# Patient Record
Sex: Female | Born: 1950 | Race: White | Hispanic: No | Smoking: Never smoker
Health system: Southern US, Community
[De-identification: ages and names within clinical notes are randomized; demographics above are authoritative.]

## PROBLEM LIST (undated history)

## (undated) DIAGNOSIS — K219 Gastro-esophageal reflux disease without esophagitis: Secondary | ICD-10-CM

## (undated) DIAGNOSIS — Z8719 Personal history of other diseases of the digestive system: Secondary | ICD-10-CM

## (undated) DIAGNOSIS — F419 Anxiety disorder, unspecified: Secondary | ICD-10-CM

## (undated) DIAGNOSIS — M810 Age-related osteoporosis without current pathological fracture: Secondary | ICD-10-CM

## (undated) DIAGNOSIS — M1990 Unspecified osteoarthritis, unspecified site: Secondary | ICD-10-CM

## (undated) DIAGNOSIS — Z79899 Other long term (current) drug therapy: Secondary | ICD-10-CM

## (undated) DIAGNOSIS — H3530 Unspecified macular degeneration: Secondary | ICD-10-CM

## (undated) DIAGNOSIS — M47812 Spondylosis without myelopathy or radiculopathy, cervical region: Secondary | ICD-10-CM

## (undated) DIAGNOSIS — T7840XA Allergy, unspecified, initial encounter: Secondary | ICD-10-CM

## (undated) DIAGNOSIS — C189 Malignant neoplasm of colon, unspecified: Secondary | ICD-10-CM

## (undated) HISTORY — DX: Anxiety disorder, unspecified: F41.9

## (undated) HISTORY — DX: Spondylosis without myelopathy or radiculopathy, cervical region: M47.812

## (undated) HISTORY — DX: Age-related osteoporosis without current pathological fracture: M81.0

## (undated) HISTORY — PX: BREAST SURGERY: SHX581

## (undated) HISTORY — DX: Unspecified osteoarthritis, unspecified site: M19.90

## (undated) HISTORY — PX: AUGMENTATION MAMMAPLASTY: SUR837

## (undated) HISTORY — PX: ABDOMINAL HYSTERECTOMY: SHX81

## (undated) HISTORY — DX: Personal history of other diseases of the digestive system: Z87.19

## (undated) HISTORY — DX: Allergy, unspecified, initial encounter: T78.40XA

## (undated) HISTORY — DX: Other long term (current) drug therapy: Z79.899

## (undated) HISTORY — DX: Malignant neoplasm of colon, unspecified: C18.9

## (undated) HISTORY — PX: APPENDECTOMY: SHX54

## (undated) HISTORY — PX: CERVICAL DISCECTOMY: SHX98

## (undated) HISTORY — PX: CATARACT EXTRACTION: SUR2

## (undated) HISTORY — DX: Gastro-esophageal reflux disease without esophagitis: K21.9

## (undated) HISTORY — DX: Unspecified macular degeneration: H35.30

---

## 1999-11-28 DIAGNOSIS — C189 Malignant neoplasm of colon, unspecified: Secondary | ICD-10-CM

## 1999-11-28 HISTORY — PX: HEMICOLECTOMY: SHX854

## 1999-11-28 HISTORY — DX: Malignant neoplasm of colon, unspecified: C18.9

## 2000-11-15 ENCOUNTER — Encounter: Payer: Self-pay | Admitting: Family Medicine

## 2002-03-03 ENCOUNTER — Encounter: Payer: Self-pay | Admitting: Family Medicine

## 2002-11-12 ENCOUNTER — Encounter: Payer: Self-pay | Admitting: Family Medicine

## 2003-01-19 ENCOUNTER — Encounter: Payer: Self-pay | Admitting: Family Medicine

## 2007-11-04 ENCOUNTER — Encounter: Payer: Self-pay | Admitting: Family Medicine

## 2009-10-27 LAB — HM PAP SMEAR

## 2017-02-28 DIAGNOSIS — H3530 Unspecified macular degeneration: Secondary | ICD-10-CM | POA: Diagnosis not present

## 2017-02-28 DIAGNOSIS — G4700 Insomnia, unspecified: Secondary | ICD-10-CM | POA: Diagnosis not present

## 2017-02-28 DIAGNOSIS — M4802 Spinal stenosis, cervical region: Secondary | ICD-10-CM | POA: Diagnosis not present

## 2017-02-28 DIAGNOSIS — M810 Age-related osteoporosis without current pathological fracture: Secondary | ICD-10-CM | POA: Diagnosis not present

## 2017-08-09 DIAGNOSIS — H2513 Age-related nuclear cataract, bilateral: Secondary | ICD-10-CM | POA: Diagnosis not present

## 2017-08-09 DIAGNOSIS — H43813 Vitreous degeneration, bilateral: Secondary | ICD-10-CM | POA: Diagnosis not present

## 2017-08-09 DIAGNOSIS — H353221 Exudative age-related macular degeneration, left eye, with active choroidal neovascularization: Secondary | ICD-10-CM | POA: Diagnosis not present

## 2017-08-09 DIAGNOSIS — H353112 Nonexudative age-related macular degeneration, right eye, intermediate dry stage: Secondary | ICD-10-CM | POA: Diagnosis not present

## 2017-08-28 LAB — HM MAMMOGRAPHY

## 2017-08-28 LAB — HEPATIC FUNCTION PANEL
ALT: 13 (ref 7–35)
AST: 18 (ref 13–35)

## 2017-08-28 LAB — LIPID PANEL
CHOLESTEROL: 164 (ref 0–200)
HDL: 74 — AB (ref 35–70)
LDL Cholesterol: 78
TRIGLYCERIDES: 64 (ref 40–160)

## 2017-08-28 LAB — CBC AND DIFFERENTIAL
HEMATOCRIT: 41 (ref 36–46)
HEMOGLOBIN: 13.7 (ref 12.0–16.0)
PLATELETS: 200 (ref 150–399)
WBC: 3

## 2017-08-28 LAB — TSH: TSH: 2.93 (ref 0.41–5.90)

## 2017-08-28 LAB — BASIC METABOLIC PANEL
BUN: 18 (ref 4–21)
Creatinine: 0.9 (ref <=1.1)
Glucose: 88
Potassium: 5 (ref 3.4–5.3)
Sodium: 144 (ref 137–147)

## 2017-08-28 LAB — VITAMIN D 25 HYDROXY (VIT D DEFICIENCY, FRACTURES): VIT D 25 HYDROXY: 31

## 2017-08-28 LAB — HEMOGLOBIN A1C: Hemoglobin A1C: 5.7

## 2017-08-28 LAB — HM DEXA SCAN

## 2017-11-29 DIAGNOSIS — H2513 Age-related nuclear cataract, bilateral: Secondary | ICD-10-CM | POA: Diagnosis not present

## 2017-11-29 DIAGNOSIS — H43813 Vitreous degeneration, bilateral: Secondary | ICD-10-CM | POA: Diagnosis not present

## 2017-11-29 DIAGNOSIS — H353112 Nonexudative age-related macular degeneration, right eye, intermediate dry stage: Secondary | ICD-10-CM | POA: Diagnosis not present

## 2017-11-29 DIAGNOSIS — H353221 Exudative age-related macular degeneration, left eye, with active choroidal neovascularization: Secondary | ICD-10-CM | POA: Diagnosis not present

## 2017-12-07 DIAGNOSIS — Z85038 Personal history of other malignant neoplasm of large intestine: Secondary | ICD-10-CM | POA: Diagnosis not present

## 2017-12-07 LAB — HM COLONOSCOPY

## 2018-01-29 DIAGNOSIS — H353221 Exudative age-related macular degeneration, left eye, with active choroidal neovascularization: Secondary | ICD-10-CM | POA: Diagnosis not present

## 2018-01-29 DIAGNOSIS — H353112 Nonexudative age-related macular degeneration, right eye, intermediate dry stage: Secondary | ICD-10-CM | POA: Diagnosis not present

## 2018-03-12 DIAGNOSIS — H353111 Nonexudative age-related macular degeneration, right eye, early dry stage: Secondary | ICD-10-CM | POA: Diagnosis not present

## 2018-03-12 DIAGNOSIS — H2513 Age-related nuclear cataract, bilateral: Secondary | ICD-10-CM | POA: Diagnosis not present

## 2018-03-12 DIAGNOSIS — H353221 Exudative age-related macular degeneration, left eye, with active choroidal neovascularization: Secondary | ICD-10-CM | POA: Diagnosis not present

## 2018-04-09 DIAGNOSIS — H353221 Exudative age-related macular degeneration, left eye, with active choroidal neovascularization: Secondary | ICD-10-CM | POA: Diagnosis not present

## 2018-04-09 DIAGNOSIS — H353111 Nonexudative age-related macular degeneration, right eye, early dry stage: Secondary | ICD-10-CM | POA: Diagnosis not present

## 2018-04-09 DIAGNOSIS — H2513 Age-related nuclear cataract, bilateral: Secondary | ICD-10-CM | POA: Diagnosis not present

## 2018-06-18 DIAGNOSIS — H353221 Exudative age-related macular degeneration, left eye, with active choroidal neovascularization: Secondary | ICD-10-CM | POA: Diagnosis not present

## 2018-06-18 DIAGNOSIS — H353111 Nonexudative age-related macular degeneration, right eye, early dry stage: Secondary | ICD-10-CM | POA: Diagnosis not present

## 2018-06-18 DIAGNOSIS — H2513 Age-related nuclear cataract, bilateral: Secondary | ICD-10-CM | POA: Diagnosis not present

## 2018-07-31 DIAGNOSIS — H353221 Exudative age-related macular degeneration, left eye, with active choroidal neovascularization: Secondary | ICD-10-CM | POA: Diagnosis not present

## 2018-07-31 DIAGNOSIS — H353111 Nonexudative age-related macular degeneration, right eye, early dry stage: Secondary | ICD-10-CM | POA: Diagnosis not present

## 2018-07-31 DIAGNOSIS — H2513 Age-related nuclear cataract, bilateral: Secondary | ICD-10-CM | POA: Diagnosis not present

## 2018-08-23 ENCOUNTER — Encounter: Payer: Self-pay | Admitting: Family Medicine

## 2018-08-23 ENCOUNTER — Other Ambulatory Visit: Payer: Self-pay

## 2018-08-23 ENCOUNTER — Ambulatory Visit (INDEPENDENT_AMBULATORY_CARE_PROVIDER_SITE_OTHER): Payer: Medicare Other | Admitting: Family Medicine

## 2018-08-23 ENCOUNTER — Encounter: Payer: Self-pay | Admitting: Emergency Medicine

## 2018-08-23 VITALS — BP 98/60 | HR 71 | Temp 98.0°F | Ht 64.25 in | Wt 118.4 lb

## 2018-08-23 DIAGNOSIS — M47812 Spondylosis without myelopathy or radiculopathy, cervical region: Secondary | ICD-10-CM

## 2018-08-23 DIAGNOSIS — Z85038 Personal history of other malignant neoplasm of large intestine: Secondary | ICD-10-CM | POA: Insufficient documentation

## 2018-08-23 DIAGNOSIS — Z23 Encounter for immunization: Secondary | ICD-10-CM | POA: Diagnosis not present

## 2018-08-23 DIAGNOSIS — G4701 Insomnia due to medical condition: Secondary | ICD-10-CM

## 2018-08-23 DIAGNOSIS — M810 Age-related osteoporosis without current pathological fracture: Secondary | ICD-10-CM | POA: Diagnosis not present

## 2018-08-23 DIAGNOSIS — H3530 Unspecified macular degeneration: Secondary | ICD-10-CM | POA: Insufficient documentation

## 2018-08-23 DIAGNOSIS — G8929 Other chronic pain: Secondary | ICD-10-CM | POA: Insufficient documentation

## 2018-08-23 DIAGNOSIS — Z8719 Personal history of other diseases of the digestive system: Secondary | ICD-10-CM | POA: Diagnosis not present

## 2018-08-23 DIAGNOSIS — Z9049 Acquired absence of other specified parts of digestive tract: Secondary | ICD-10-CM | POA: Insufficient documentation

## 2018-08-23 DIAGNOSIS — Z79899 Other long term (current) drug therapy: Secondary | ICD-10-CM | POA: Diagnosis not present

## 2018-08-23 HISTORY — DX: Spondylosis without myelopathy or radiculopathy, cervical region: M47.812

## 2018-08-23 HISTORY — DX: Age-related osteoporosis without current pathological fracture: M81.0

## 2018-08-23 HISTORY — DX: Personal history of other diseases of the digestive system: Z87.19

## 2018-08-23 HISTORY — DX: Other long term (current) drug therapy: Z79.899

## 2018-08-23 MED ORDER — TRAMADOL HCL 50 MG PO TABS: 50.0000 mg | ORAL_TABLET | Freq: Four times a day (QID) | ORAL | 5 refills | Status: DC

## 2018-08-23 MED ORDER — LORAZEPAM 1 MG PO TABS: 1.0000 mg | ORAL_TABLET | Freq: Every day | ORAL | 3 refills | Status: DC

## 2018-08-23 MED ORDER — ZOSTER VAC RECOMB ADJUVANTED 50 MCG/0.5ML IM SUSR: 0.5000 mL | Freq: Once | INTRAMUSCULAR | 0 refills | Status: AC

## 2018-08-23 NOTE — Patient Instructions (Signed)
Please returfor your annual complete physical; please come fasting. n in October or November  Medicare recommends an [employer] Visit for all patients. Please schedule this to be done with our Nurse Educator, Maudie Mercury. This is an informative "talk" visit; it's goals are to ensure that your health care needs are being met and to give you education regarding avoiding falls, ensuring you are not suffering from depression or problems with memory or thinking, and to educate you on Advance Care Planning. It helps me take good care of you!  It was a pleasure meeting you today! Thank you for choosing [place] to meet your healthcare needs! I truly look forward to working with you. If you have any questions or concerns, please send me a message via Mychart or call the office at [phone].

## 2018-08-23 NOTE — Progress Notes (Signed)
Subjective  CC:  Chief Complaint  Patient presents with  . Establish Care    Relocated here from [place], last Physcical 08/28/2017  . Neck Pain    from Spinal Fusion surgery     HPI: Haley Huerta is a 67 y.o. female who presents to [place] at [facility] today to establish care with me as a new patient.   She has the following concerns or needs:  A pleasant 67 year old divorced single female who has a son who lives here in [place], this is the reason she recently relocated from [place].  Overall she does very well.  She brings in her medical records for my review.  I reviewed past medical records for the last several years by her PCP, past surgical reports, past colonoscopy, immunizations and lab work.  Also reviewed her recent bone density.  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  She does well with this.  She is had no adverse effects.  She also uses Ativan at night to help relax the musculature and help her sleep.  This combination has worked for her over the last several years.  She is hesitant to change it.  She requests refills.  She denies mood problems, cognitive dysfunction or falls.  She has a history of colon cancer status post hemicolectomy and history of ulcerative colitis.  Her colonoscopy cancer and surveillance is up-to-date.  She will need a gastroenterology referral in the next year or 2 for surveillance.  She has no current issues with colitis.  She eats a healthy diet.  Bowel movements are normal.  Osteoporosis by recent bone density scan.  Takes over-the-counter vitamin D and alternative therapies.  Has not ever taken biphosphonate's or other bone therapies.  Health maintenance: Due for pneumococcal vaccination and flu shot.  Also can get Shingrix.  Next physical due in October.  Lab work due at that  time.  Assessment  1. Need for influenza vaccination   2. Insomnia secondary to chronic pain   3. History of colon cancer   4. S/P left hemicolectomy   5. Spondylosis of cervical region without myelopathy or radiculopathy   6. Need for pneumococcal vaccination   7. Age-related osteoporosis without current pathological fracture   8. Chronic use of benzodiazepine for therapeutic purpose   9. History of ulcerative colitis      Plan   Chronic medical problems are well controlled.  We reviewed her current pain management strategy.  We will continue unchanged.  Medications refilled.  Controlled substance contract signed.  Will get urine drug screen at next visit.  Monitor use.  Reiterate caution and risk versus benefits.  Patient highly functional now.  Doing well.  Immunizations updated today.  Started conversation regarding osteoporosis treatment options.  Could possibly tolerate biphosphonate's.  If is intolerant can consider Prolia.  Will check blood work at next visit including calcium and vitamin D.  Follow up:  Return in about 6 weeks (around 10/04/2018) for complete physical, AWV at patient's convenience. Orders Placed This Encounter  Procedures  . HM MAMMOGRAPHY  . HM DEXA SCAN  . Flu vaccine HIGH DOSE PF  . Pneumococcal polysaccharide vaccine 23-valent greater than or equal to 2yo subcutaneous/IM  . HM PAP SMEAR  . HM COLONOSCOPY   Meds ordered this encounter  Medications  . LORazepam (ATIVAN) 1  MG tablet    Sig: Take 1 tablet (1 mg total) by mouth at bedtime.    Dispense:  90 tablet    Refill:  3  . traMADol (ULTRAM) 50 MG tablet    Sig: Take 1 tablet (50 mg total) by mouth 4 (four) times daily.    Dispense:  120 tablet    Refill:  5  . Zoster Vaccine Adjuvanted [facility]) injection    Sig: Inject 0.5 mLs into the muscle once for 1 dose. Please give 2nd dose 2-6 months after first dose    Dispense:  2 each    Refill:  0     Depression screen [facility] 2/9 08/23/2018    Decreased Interest 0  Down, Depressed, Hopeless 0  PHQ - 2 Score 0    We updated and reviewed the patient's past history in detail and it is documented below.  Patient Active Problem List   Diagnosis Date Noted  . 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 History of colon cancer 08/23/2018  . S/P left hemicolectomy 08/23/2018    Chemo and radiation and hemicolectomy 2002   . DJD (degenerative joint disease) of cervical spine 08/23/2018    Chronic pain on tramadol   . Macular degeneration 08/23/2018  . Osteoporosis 08/23/2018    Dexa 08/2017 lowest T=-2.8; vit d and calcium; rec tx, pt to think about it.    . Chronic use of benzodiazepine for therapeutic purpose 08/23/2018    Uses for neck pain, muscle tension (failed tricyclics, mm relaxers) and neck pain.   Marland Kitchen History of ulcerative colitis 08/23/2018   Health Maintenance  Topic Date Due  . Hepatitis C Screening  [birth year]  . PNA vac Low Risk Adult (2 of 2 - PCV13) 08/24/2019  . MAMMOGRAM  08/29/2019  . COLONOSCOPY  12/07/2020  . TETANUS/TDAP  08/07/2024  . INFLUENZA VACCINE  Completed  . DEXA SCAN  Completed   Immunization History  Administered Date(s) Administered  . Influenza, High Dose Seasonal PF 08/23/2018  . Pneumococcal Polysaccharide-23 08/23/2018  . Tdap 08/07/2014  . Zoster 08/06/2013   Current Meds  Medication Sig  . LORazepam (ATIVAN) 1 MG tablet Take 1 tablet (1 mg total) by mouth at bedtime.  . traMADol (ULTRAM) 50 MG tablet Take 1 tablet (50 mg total) by mouth 4 (four) times daily.  . [DISCONTINUED] LORazepam (ATIVAN) 1 MG tablet Take 1 mg by mouth at bedtime.  . [DISCONTINUED] traMADol (ULTRAM) 50 MG tablet Take 50 mg by mouth 4 (four) times daily.    Allergies: Patient has No Known Allergies. Past Medical History Patient  has a past medical history of Allergy, Arthritis, Chronic use of benzodiazepine for therapeutic purpose (08/23/2018), Colon cancer ([place]) (2001), DJD (degenerative joint  disease) of cervical spine (08/23/2018), GERD (gastroesophageal reflux disease), History of ulcerative colitis (08/23/2018), and Osteoporosis (08/23/2018). Past Surgical History Patient  has no past surgical history on file. Family History: Patient family history includes Arthritis in her mother; Congestive Heart Failure in her father; Hypertension in her father; Prostate cancer in her father; Restless legs syndrome in her father and son; Stroke in her mother. Social History:  Patient  reports that she has never smoked. She has never used smokeless tobacco. She reports that she drinks alcohol. She reports that she does not use drugs.  Review of Systems: Constitutional: negative for fever or malaise Ophthalmic: negative for photophobia, double vision or loss of vision Cardiovascular: negative for chest pain, dyspnea on exertion, or new LE swelling  Respiratory: negative for SOB or persistent cough Gastrointestinal: negative for abdominal pain, change in bowel habits or melena Genitourinary: negative for dysuria or gross hematuria Musculoskeletal: negative for new gait disturbance or muscular weakness Integumentary: negative for new or persistent rashes Neurological: negative for TIA or stroke symptoms Psychiatric: negative for SI or delusions Allergic/Immunologic: negative for hives  Patient Care Team    Relationship Specialty Notifications Start End  Leamon Arnt, MD PCP - General Family Medicine  08/23/18   Sherlynn Stalls, MD Consulting Physician Ophthalmology  08/23/18     Objective  Vitals: BP 98/60   Pulse 71   Temp 98 F (36.7 C)   Ht 5' 4.25" (1.632 m)   Wt 118 lb 6.4 oz (53.7 kg)   SpO2 99%   BMI 20.17 kg/m  General:  Well developed, well nourished, no acute distress  Psych:  Alert and oriented,normal mood and affect, appears happy HEENT:  Normocephalic, atraumatic, non-icteric sclera, PERRL, oropharynx is without mass or exudate, supple neck without adenopathy, mass or  thyromegaly, nontender spine Cardiovascular:  RRR without gallop, rub or murmur, nondisplaced PMI Respiratory:  Good breath sounds bilaterally, CTAB with normal respiratory effort Gastrointestinal: normal bowel sounds, soft, non-tender, no noted masses. No HSM MSK: no deformities, contusions. Joints are without erythema or swelling Skin:  Warm, no rashes or suspicious lesions noted Neurologic:    Mental status is normal. Gross motor and sensory exams are normal. Normal gait   Commons side effects, risks, benefits, and alternatives for medications and treatment plan prescribed today were discussed, and the patient expressed understanding of the given instructions. Patient is instructed to call or message via MyChart if he/she has any questions or concerns regarding our treatment plan. No barriers to understanding were identified. We discussed Red Flag symptoms and signs in detail. Patient expressed understanding regarding what to do in case of urgent or emergency type symptoms.   Medication list was reconciled, printed and provided to the patient in AVS. Patient instructions and summary information was reviewed with the patient as documented in the AVS. This note was prepared with assistance of Dragon voice recognition software. Occasional wrong-word or sound-a-like substitutions may have occurred due to the inherent limitations of voice recognition software

## 2018-08-27 ENCOUNTER — Encounter: Payer: Self-pay | Admitting: Emergency Medicine

## 2018-09-27 DIAGNOSIS — H353221 Exudative age-related macular degeneration, left eye, with active choroidal neovascularization: Secondary | ICD-10-CM | POA: Diagnosis not present

## 2018-09-27 DIAGNOSIS — H353111 Nonexudative age-related macular degeneration, right eye, early dry stage: Secondary | ICD-10-CM | POA: Diagnosis not present

## 2018-09-27 DIAGNOSIS — H2513 Age-related nuclear cataract, bilateral: Secondary | ICD-10-CM | POA: Diagnosis not present

## 2018-10-01 ENCOUNTER — Encounter: Payer: Self-pay | Admitting: Family Medicine

## 2018-10-15 NOTE — Progress Notes (Signed)
Subjective:   Haley Huerta is a 67 y.o. female who presents for Medicare Annual (Subsequent) preventive examination.  Review of Systems:  No ROS.  Medicare Wellness Visit. Additional risk factors are reflected in the social history.  Cardiac Risk Factors include: advanced age (>56men, >66 women);family history of premature cardiovascular disease   Sleep patterns: Sleeps 7-8 hours, interrupted d/t neck/shoulder.  Home Safety/Smoke Alarms: Feels safe in home. Smoke alarms in place.  Living environment; residence and Firearm Safety: Lives alone in 1 story home.  Seat Belt Safety/Bike Helmet: Wears seat belt.   Female:   Pap-N/A    Mammo-08/28/2017, normal. Prefers every 2 years.        Dexa scan-08/28/2017       CCS-Colonoscopy 12/07/2017, [place]. Pt reports normal. Recall 3-5 years. H/O colon Ca.      Objective:     Vitals: BP 128/68 (BP Location: Left Arm, Patient Position: Sitting, Cuff Size: Normal)   Pulse 68   Temp 98 F (36.7 C) (Temporal)   Resp 16   Ht 5\' 4"  (1.626 m)   Wt 119 lb 4 oz (54.1 kg)   SpO2 98%   BMI 20.47 kg/m   Body mass index is 20.47 kg/m.  Advanced Directives 10/16/2018  Does Patient Have a Medical Advance Directive? No  Would patient like information on creating a medical advance directive? Yes (MAU/Ambulatory/Procedural Areas - Information given)    Tobacco Social History   Tobacco Use  Smoking Status Never Smoker  Smokeless Tobacco Never Used     Counseling given: Not Answered    Past Medical History:  Diagnosis Date  . Allergy   . Arthritis   . 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  . Colon cancer ([place]) 2001  . 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 GERD (gastroesophageal reflux disease)   . History of ulcerative colitis 08/23/2018  . Osteoporosis 08/23/2018     Dexa 08/2017 lowest T=-2.8; vit d and calcium; rec tx, pt to think about it.    Past Surgical History:  Procedure Laterality Date  . ABDOMINAL HYSTERECTOMY    . APPENDECTOMY    . BREAST SURGERY     Implants  . CERVICAL DISCECTOMY     Fusion   . HEMICOLECTOMY  2001   R- Cancer    Family History  Problem Relation Age of Onset  . Arthritis Mother   . Stroke Mother   . Prostate cancer Father   . Congestive Heart Failure Father   . Hypertension Father   . Restless legs syndrome Father   . Depression Son   . Restless legs syndrome Brother    Social History   Socioeconomic History  . Marital status: Divorced    Spouse name: Not on file  . Number of children: 1  . Years of education: Not on file  . Highest education level: Not on file  Occupational History  . Occupation: retired, Quarry manager etc  Social Needs  . Financial resource strain: Not on file  . Food insecurity:    Worry: Not on file    Inability: Not on file  . Transportation needs:    Medical: Not on file    Non-medical: Not on file  Tobacco Use  . Smoking status: Never Smoker  . Smokeless tobacco: Never Used  Substance and Sexual Activity  .  Alcohol use: Yes    Comment: rarely   . Drug use: Never  . Sexual activity: Not Currently    Birth control/protection: Post-menopausal  Lifestyle  . Physical activity:    Days per week: Not on file    Minutes per session: Not on file  . Stress: Not on file  Relationships  . Social connections:    Talks on phone: Not on file    Gets together: Not on file    Attends religious service: Not on file    Active member of club or organization: Not on file    Attends meetings of clubs or organizations: Not on file    Relationship status: Not on file  Other Topics Concern  . Not on file  Social History Narrative  . Not on file    Outpatient Encounter Medications as of 10/16/2018  Medication Sig  . diphenhydrAMINE (BENADRYL) 25 MG tablet Take 25 mg by  mouth every 6 (six) hours as needed.  . fluticasone (FLONASE) 50 MCG/ACT nasal spray Place into both nostrils daily.  Marland Kitchen ibuprofen (ADVIL,MOTRIN) 200 MG tablet Take 200 mg by mouth every 6 (six) hours as needed.  Marland Kitchen LORazepam (ATIVAN) 1 MG tablet Take 1 tablet (1 mg total) by mouth at bedtime.  . Multiple Vitamins-Minerals (PRESERVISION AREDS PO) Take by mouth.  . NON FORMULARY 1 % every morning.  . NON FORMULARY daily.  . traMADol (ULTRAM) 50 MG tablet Take 1 tablet (50 mg total) by mouth 4 (four) times daily.   No facility-administered encounter medications on file as of 10/16/2018.     Activities of Daily Living In your present state of health, do you have any difficulty performing the following activities: 10/16/2018 08/23/2018  Hearing? N N  Vision? N N  Comment - Macular Degneration   Difficulty concentrating or making decisions? N N  Walking or climbing stairs? N N  Dressing or bathing? N N  Doing errands, shopping? N N  Preparing Food and eating ? N -  Using the Toilet? N -  In the past six months, have you accidently leaked urine? N -  Do you have problems with loss of bowel control? N -  Managing your Medications? N -  Managing your Finances? N -  Housekeeping or managing your Housekeeping? N -  Some recent data might be hidden    Patient Care Team: Leamon Arnt, MD as PCP - General (Family Medicine) Sherlynn Stalls, MD as Consulting Physician (Ophthalmology)    Assessment:   This is a routine wellness examination for [place].  Exercise Activities and Dietary recommendations Current Exercise Habits: Home exercise routine(Walks up/down stairs between buildings in neighborhood), Type of exercise: walking, Time (Minutes): 40, Frequency (Times/Week): 3, Weekly Exercise (Minutes/Week): 120, Exercise limited by: None identified   Diet (meal preparation, eat out, water intake, caffeinated beverages, dairy products, fruits and vegetables): Drinks water and tea.    Breakfast: eggs; cereal; pancakes; tea Lunch: soup, sandwich Dinner: protein shake  Goals    . Patient Stated     Increase social activity.        Fall Risk Fall Risk  10/16/2018 08/23/2018  Falls in the past year? 0 No    Depression Screen PHQ 2/9 Scores 10/16/2018 08/23/2018  PHQ - 2 Score 0 0     Cognitive Function MMSE - Mini Mental State Exam 10/16/2018  Orientation to time 4  Orientation to Place 5  Registration 3  Attention/ Calculation 5  Recall 2  Language- name  2 objects 2  Language- repeat 1  Language- follow 3 step command 3  Language- read & follow direction 1  Write a sentence 1  Copy design 1  Total score 28        Immunization History  Administered Date(s) Administered  . Influenza, High Dose Seasonal PF 08/23/2018  . Pneumococcal Polysaccharide-23 08/23/2018  . Tdap 08/07/2014  . Zoster 08/06/2013  . Zoster Recombinat (Shingrix) 09/26/2018     Screening Tests Health Maintenance  Topic Date Due  . Hepatitis C Screening  10/17/2019 (Originally Nov 17, 1951)  . PNA vac Low Risk Adult (2 of 2 - PCV13) 08/24/2019  . MAMMOGRAM  08/29/2019  . DEXA SCAN  08/29/2019  . COLONOSCOPY  12/07/2020  . TETANUS/TDAP  08/07/2024  . INFLUENZA VACCINE  Completed       Plan:    Continue doing brain stimulating activities (puzzles, reading, adult coloring books, staying active) to keep memory sharp.   Bring a copy of your living will and/or healthcare power of attorney to your next office visit.  I have personally reviewed and noted the following in the patient's chart:   . Medical and social history . Use of alcohol, tobacco or illicit drugs  . Current medications and supplements . Functional ability and status . Nutritional status . Physical activity . Advanced directives . List of other physicians . Hospitalizations, surgeries, and ER visits in previous 12 months . Vitals . Screenings to include cognitive, depression, and  falls . Referrals and appointments  In addition, I have reviewed and discussed with patient certain preventive protocols, quality metrics, and best practice recommendations. A written personalized care plan for preventive services as well as general preventive health recommendations were provided to patient.     Gerilyn Nestle, RN  10/16/2018

## 2018-10-16 ENCOUNTER — Ambulatory Visit (INDEPENDENT_AMBULATORY_CARE_PROVIDER_SITE_OTHER): Payer: Medicare Other | Admitting: Family Medicine

## 2018-10-16 ENCOUNTER — Encounter: Payer: Self-pay | Admitting: Family Medicine

## 2018-10-16 ENCOUNTER — Other Ambulatory Visit: Payer: Self-pay

## 2018-10-16 ENCOUNTER — Ambulatory Visit (INDEPENDENT_AMBULATORY_CARE_PROVIDER_SITE_OTHER): Payer: Medicare Other

## 2018-10-16 VITALS — BP 128/68 | HR 68 | Temp 98.0°F | Resp 16 | Ht 64.0 in | Wt 119.2 lb

## 2018-10-16 VITALS — BP 128/68 | HR 68 | Temp 98.0°F | Resp 16 | Ht 64.0 in | Wt 119.4 lb

## 2018-10-16 DIAGNOSIS — Z79899 Other long term (current) drug therapy: Secondary | ICD-10-CM

## 2018-10-16 DIAGNOSIS — Z85038 Personal history of other malignant neoplasm of large intestine: Secondary | ICD-10-CM

## 2018-10-16 DIAGNOSIS — Z0000 Encounter for general adult medical examination without abnormal findings: Secondary | ICD-10-CM | POA: Diagnosis not present

## 2018-10-16 DIAGNOSIS — M810 Age-related osteoporosis without current pathological fracture: Secondary | ICD-10-CM

## 2018-10-16 DIAGNOSIS — M47812 Spondylosis without myelopathy or radiculopathy, cervical region: Secondary | ICD-10-CM | POA: Diagnosis not present

## 2018-10-16 DIAGNOSIS — Z8719 Personal history of other diseases of the digestive system: Secondary | ICD-10-CM

## 2018-10-16 DIAGNOSIS — Z1159 Encounter for screening for other viral diseases: Secondary | ICD-10-CM | POA: Diagnosis not present

## 2018-10-16 LAB — CBC WITH DIFFERENTIAL/PLATELET
Basophils Absolute: 0 10*3/uL (ref 0.0–0.1)
Basophils Relative: 0.8 % (ref 0.0–3.0)
EOS PCT: 2 % (ref 0.0–5.0)
Eosinophils Absolute: 0.1 10*3/uL (ref 0.0–0.7)
HCT: 40.7 % (ref 36.0–46.0)
Hemoglobin: 13.5 g/dL (ref 12.0–15.0)
LYMPHS ABS: 1.6 10*3/uL (ref 0.7–4.0)
Lymphocytes Relative: 29.2 % (ref 12.0–46.0)
MCHC: 33.2 g/dL (ref 30.0–36.0)
MCV: 96.6 fl (ref 78.0–100.0)
MONOS PCT: 9.7 % (ref 3.0–12.0)
Monocytes Absolute: 0.5 10*3/uL (ref 0.1–1.0)
Neutro Abs: 3.2 10*3/uL (ref 1.4–7.7)
Neutrophils Relative %: 58.3 % (ref 43.0–77.0)
Platelets: 229 10*3/uL (ref 150.0–400.0)
RBC: 4.21 Mil/uL (ref 3.87–5.11)
RDW: 14 % (ref 11.5–15.5)
WBC: 5.4 10*3/uL (ref 4.0–10.5)

## 2018-10-16 LAB — COMPREHENSIVE METABOLIC PANEL
ALT: 12 U/L (ref 0–35)
AST: 20 U/L (ref 0–37)
Albumin: 4.1 g/dL (ref 3.5–5.2)
Alkaline Phosphatase: 56 U/L (ref 39–117)
BUN: 21 mg/dL (ref 6–23)
CO2: 28 meq/L (ref 19–32)
Calcium: 9.2 mg/dL (ref 8.4–10.5)
Chloride: 100 mEq/L (ref 96–112)
Creatinine, Ser: 0.94 mg/dL (ref 0.40–1.20)
GFR: 62.75 mL/min (ref >60.00)
GLUCOSE: 80 mg/dL (ref 70–99)
Potassium: 3.7 mEq/L (ref 3.5–5.1)
Sodium: 137 mEq/L (ref 135–145)
Total Bilirubin: 0.6 mg/dL (ref 0.2–1.2)
Total Protein: 6.9 g/dL (ref 6.0–8.3)

## 2018-10-16 LAB — LIPID PANEL
CHOL/HDL RATIO: 2
Cholesterol: 165 mg/dL (ref 0–200)
HDL: 73.2 mg/dL (ref >39.00)
LDL Cholesterol: 78 mg/dL (ref 0–99)
NONHDL: 91.74
Triglycerides: 68 mg/dL (ref 0.0–149.0)
VLDL: 13.6 mg/dL (ref 0.0–40.0)

## 2018-10-16 LAB — TSH: TSH: 4.51 u[IU]/mL — ABNORMAL HIGH (ref 0.35–4.50)

## 2018-10-16 NOTE — Progress Notes (Signed)
I have reviewed the documentation from the recent AWV done by Kim Broome; I agree with the documentation and will follow up on any recommendations or abnormal findings as suggested.  

## 2018-10-16 NOTE — Progress Notes (Signed)
Subjective  Chief Complaint  Patient presents with  . Annual Exam    doing well, no complaints     HPI: Haley Huerta is a 67 y.o. female who presents to [place] at [facility] today for a Female Wellness Visit. She also has the concerns and/or needs as listed above in the chief complaint. These will be addressed in addition to the Health Maintenance Visit.   Wellness Visit: annual visit with health maintenance review and exam without Pap   Health maintenance: Due for mammography but patient defers.  Prefers 2-year screenings due to having firm breast implants and difficulty with exam.  She understands risks versus benefits and current recommendations.  Immunizations are up-to-date.  Reviewed annual wellness visit today Chronic disease f/u and/or acute problem visit: (deemed necessary to be done in addition to the wellness visit):  Follow-up osteoporosis: We had a discussion last visit and reviewed her most recent bone density scan.  Due to her history of ulcerative colitis and hemicolectomy we are going to elect Prolia treatment.  Want to avoid any gastritis or GI side effects from biphosphonate's.  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  Ulcerative colitis and colon cancer surveillance are stable and up-to-date   Assessment  1. Annual physical exam   2. History of ulcerative colitis   3. History of colon cancer- cecal adenocarcinoma   4. 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 Chronic use of benzodiazepine for therapeutic purpose   6. Spondylosis of cervical region without myelopathy or radiculopathy      Plan  Female Wellness Visit:  Age appropriate Health Maintenance and Prevention measures were discussed with patient. Included topics are cancer screening recommendations, ways to keep healthy (see AVS) including dietary and exercise recommendations, regular eye and dental care, use of seat  belts, and avoidance of moderate alcohol use and tobacco use.  Defer mammogram this year per patient request.  Both mammogram and bone density will be done next year  BMI: discussed patient's BMI and encouraged positive lifestyle modifications to help get to or maintain a target BMI.  HM needs and immunizations were addressed and ordered. See below for orders. See HM and immunization section for updates.  Routine labs and screening tests ordered including cmp, cbc and lipids where appropriate.  Discussed recommendations regarding Vit D and calcium supplementation (see AVS)  Chronic disease management visit and/or acute problem visit:  Osteoporosis: Start Prolia biannually.  Check vitamin D and calcium today.  After visit summary given on medication.  Chronic pain and insomnia due to pain well controlled.  Controlled substance agreement has been signed. Follow up: Return in about 6 months (around 04/16/2019) for Pain management.  Orders Placed This Encounter  Procedures  . Comprehensive metabolic panel  . CBC with Differential/Platelet  . Lipid panel  . TSH  . Hepatitis C antibody  . VITAMIN D 25 Hydroxy (Vit-D Deficiency, Fractures)  . Pain Mgmt, Profile 8 w/Conf, U   No orders of the defined types were placed in this encounter.     Lifestyle: Body mass index is 20.49 kg/m. Wt Readings from Last 3 Encounters:  10/16/18 119 lb 6.4 oz (54.2 kg)  10/16/18 119 lb 4 oz (54.1 kg)  08/23/18 118 lb 6.4 oz (53.7 kg)    Patient Active Problem List   Diagnosis Date Noted  . 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 History of colon cancer- cecal adenocarcinoma 08/23/2018  . S/P  left hemicolectomy 08/23/2018    Chemo and radiation and hemicolectomy 2002   . DJD (degenerative joint disease) of cervical spine 08/23/2018    Chronic pain on tramadol   . Macular degeneration 08/23/2018  . Osteoporosis 08/23/2018    Dexa 08/2017 lowest T=-2.8; vit d and calcium; rec tx, to  start prolia 09/2018   . Chronic use of benzodiazepine for therapeutic purpose 08/23/2018    Uses for neck pain, muscle tension (failed tricyclics, mm relaxers) and neck pain.   Marland Kitchen History of ulcerative colitis 08/23/2018   Health Maintenance  Topic Date Due  . MAMMOGRAM  08/28/2019 (Originally 08/28/2018)  . Hepatitis C Screening  10/17/2019 (Originally September 03, 1951)  . PNA vac Low Risk Adult (2 of 2 - PCV13) 08/24/2019  . DEXA SCAN  08/29/2019  . COLONOSCOPY  12/07/2020  . TETANUS/TDAP  08/07/2024  . INFLUENZA VACCINE  Completed   Immunization History  Administered Date(s) Administered  . Influenza, High Dose Seasonal PF 08/23/2018  . Pneumococcal Polysaccharide-23 08/23/2018  . Tdap 08/07/2014  . Zoster 08/06/2013  . Zoster Recombinat (Shingrix) 09/26/2018   We updated and reviewed the patient's past history in detail and it is documented below. Allergies: Patient is allergic to phenergan [promethazine hcl]. Past Medical History Patient  has a past medical history of Allergy, Arthritis, Chronic use of benzodiazepine for therapeutic purpose (08/23/2018), Colon cancer ([place]) (2001), DJD (degenerative joint disease) of cervical spine (08/23/2018), GERD (gastroesophageal reflux disease), History of ulcerative colitis (08/23/2018), and Osteoporosis (08/23/2018). Past Surgical History Patient  has a past surgical history that includes Abdominal hysterectomy; Hemicolectomy (2001); Cervical discectomy; Breast surgery; and Appendectomy. Family History: Patient family history includes Arthritis in her mother; Congestive Heart Failure in her father; Depression in her son; Hypertension in her father; Prostate cancer in her father; Restless legs syndrome in her brother and father; Stroke in her mother. Social History:  Patient  reports that she has never smoked. She has never used smokeless tobacco. She reports that she drinks alcohol. She reports that she does not use drugs.  Review of  Systems: Constitutional: negative for fever or malaise Ophthalmic: negative for photophobia, double vision or loss of vision Cardiovascular: negative for chest pain, dyspnea on exertion, or new LE swelling Respiratory: negative for SOB or persistent cough Gastrointestinal: negative for abdominal pain, change in bowel habits or melena Genitourinary: negative for dysuria or gross hematuria, no abnormal uterine bleeding or disharge Musculoskeletal: negative for new gait disturbance or muscular weakness Integumentary: negative for new or persistent rashes, no breast lumps Neurological: negative for TIA or stroke symptoms Psychiatric: negative for SI or delusions Allergic/Immunologic: negative for hives  Patient Care Team    Relationship Specialty Notifications Start End  Leamon Arnt, MD PCP - General Family Medicine  08/23/18   Sherlynn Stalls, MD Consulting Physician Ophthalmology  08/23/18     Objective  Vitals: BP 128/68   Pulse 68   Temp 98 F (36.7 C)   Resp 16   Ht 5\' 4"  (1.626 m)   Wt 119 lb 6.4 oz (54.2 kg)   SpO2 98%   BMI 20.49 kg/m  General:  Well developed, well nourished, no acute distress  Psych:  Alert and orientedx3,normal mood and affect HEENT:  Normocephalic, atraumatic, non-icteric sclera, PERRL, oropharynx is clear without mass or exudate, supple neck without adenopathy, mass or thyromegaly Cardiovascular:  Normal S1, S2, RRR without gallop, rub or murmur, nondisplaced PMI Respiratory:  Good breath sounds bilaterally, CTAB with normal respiratory effort Gastrointestinal: normal  bowel sounds, soft, non-tender, no noted masses. No HSM MSK: no deformities, contusions. Joints are without erythema or swelling. Spine and CVA region are nontender Skin:  Warm, no rashes or suspicious lesions noted Neurologic:    Mental status is normal. CN 2-11 are normal. Gross motor and sensory exams are normal. Normal gait.  Fine tremor present Breast Exam: Bilateral breast  implants, hard and on right limiting exam bilaterally.  No mass, skin retraction or nipple discharge is appreciated in either breast. No axillary adenopathy. Fibrocystic changes are not noted   Commons side effects, risks, benefits, and alternatives for medications and treatment plan prescribed today were discussed, and the patient expressed understanding of the given instructions. Patient is instructed to call or message via MyChart if he/she has any questions or concerns regarding our treatment plan. No barriers to understanding were identified. We discussed Red Flag symptoms and signs in detail. Patient expressed understanding regarding what to do in case of urgent or emergency type symptoms.   Medication list was reconciled, printed and provided to the patient in AVS. Patient instructions and summary information was reviewed with the patient as documented in the AVS. This note was prepared with assistance of Dragon voice recognition software. Occasional wrong-word or sound-a-like substitutions may have occurred due to the inherent limitations of voice recognition software

## 2018-10-16 NOTE — Patient Instructions (Signed)
Please return in 6 months Chronic pain management.  If you have any questions or concerns, please don't hesitate to send me a message via MyChart or call the office at [phone]. Thank you for visiting with [place] today! It's our pleasure caring for you.  We will call you to set up your appointment for Prolia injection to treat your osteoporosis.   Please do these things to maintain good health!   Exercise at least 30-45 minutes a day,  4-5 days a week.   Eat a low-fat diet with lots of fruits and vegetables, up to 7-9 servings per day.  Drink plenty of water daily. Try to drink 8 8oz glasses per day.  Seatbelts can save your life. Always wear your seatbelt.  Place Smoke Detectors on every level of your home and check batteries every year.  Schedule an appointment with an eye doctor for an eye exam every 1-2 years  Safe sex - use condoms to protect yourself from STDs if you could be exposed to these types of infections. Use birth control if you do not want to become pregnant and are sexually active.  Avoid heavy alcohol use. If you drink, keep it to less than 2 drinks/day and not every day.  [place].  Choose someone you trust that could speak for you if you became unable to speak for yourself.  Depression is common in our stressful world.If you're feeling down or losing interest in things you normally enjoy, please come in for a visit.  If anyone is threatening or hurting you, please get help. Physical or Emotional Violence is never OK.   Denosumab injection What is this medicine? DENOSUMAB (den oh sue mab) slows bone breakdown. Prolia is used to treat osteoporosis in women after menopause and in men. Delton See is used to treat a high calcium level due to cancer and to prevent bone fractures and other bone problems caused by multiple myeloma or cancer bone metastases. Delton See is also used to treat giant cell tumor of the bone. This medicine may be used for other  purposes; ask your health care provider or pharmacist if you have questions. COMMON BRAND NAME(S): Prolia, XGEVA What should I tell my health care provider before I take this medicine? They need to know if you have any of these conditions: -dental disease -having surgery or tooth extraction -infection -kidney disease -low levels of calcium or Vitamin D in the blood -malnutrition -on hemodialysis -skin conditions or sensitivity -thyroid or parathyroid disease -an unusual reaction to denosumab, other medicines, foods, dyes, or preservatives -pregnant or trying to get pregnant -breast-feeding How should I use this medicine? This medicine is for injection under the skin. It is given by a health care professional in a hospital or clinic setting. If you are getting Prolia, a special MedGuide will be given to you by the pharmacist with each prescription and refill. Be sure to read this information carefully each time. For Prolia, talk to your pediatrician regarding the use of this medicine in children. Special care may be needed. For Delton See, talk to your pediatrician regarding the use of this medicine in children. While this drug may be prescribed for children as young as 13 years for selected conditions, precautions do apply. Overdosage: If you think you have taken too much of this medicine contact a poison control center or emergency room at once. NOTE: This medicine is only for you. Do not share this medicine with others. What if I miss a dose?  It is important not to miss your dose. Call your doctor or health care professional if you are unable to keep an appointment. What may interact with this medicine? Do not take this medicine with any of the following medications: -other medicines containing denosumab This medicine may also interact with the following medications: -medicines that lower your chance of fighting infection -steroid medicines like prednisone or cortisone This list may not  describe all possible interactions. Give your health care provider a list of all the medicines, herbs, non-prescription drugs, or dietary supplements you use. Also tell them if you smoke, drink alcohol, or use illegal drugs. Some items may interact with your medicine. What should I watch for while using this medicine? Visit your doctor or health care professional for regular checks on your progress. Your doctor or health care professional may order blood tests and other tests to see how you are doing. Call your doctor or health care professional for advice if you get a fever, chills or sore throat, or other symptoms of a cold or flu. Do not treat yourself. This drug may decrease your body's ability to fight infection. Try to avoid being around people who are sick. You should make sure you get enough calcium and vitamin D while you are taking this medicine, unless your doctor tells you not to. Discuss the foods you eat and the vitamins you take with your health care professional. See your dentist regularly. Brush and floss your teeth as directed. Before you have any dental work done, tell your dentist you are receiving this medicine. Do not become pregnant while taking this medicine or for 5 months after stopping it. Talk with your doctor or health care professional about your birth control options while taking this medicine. Women should inform their doctor if they wish to become pregnant or think they might be pregnant. There is a potential for serious side effects to an unborn child. Talk to your health care professional or pharmacist for more information. What side effects may I notice from receiving this medicine? Side effects that you should report to your doctor or health care professional as soon as possible: -allergic reactions like skin rash, itching or hives, swelling of the face, lips, or tongue -bone pain -breathing problems -dizziness -jaw pain, especially after dental work -redness,  blistering, peeling of the skin -signs and symptoms of infection like fever or chills; cough; sore throat; pain or trouble passing urine -signs of low calcium like fast heartbeat, muscle cramps or muscle pain; pain, tingling, numbness in the hands or feet; seizures -unusual bleeding or bruising -unusually weak or tired Side effects that usually do not require medical attention (report to your doctor or health care professional if they continue or are bothersome): -constipation -diarrhea -headache -joint pain -loss of appetite -muscle pain -runny nose -tiredness -upset stomach This list may not describe all possible side effects. Call your doctor for medical advice about side effects. You may report side effects to FDA at 1-800-FDA-1088. Where should I keep my medicine? This medicine is only given in a clinic, doctor's office, or other health care setting and will not be stored at home. NOTE: This sheet is a summary. It may not cover all possible information. If you have questions about this medicine, talk to your doctor, pharmacist, or health care provider.  2018 Elsevier/Gold Standard (2016-12-05 19:17:21)

## 2018-10-16 NOTE — Patient Instructions (Addendum)
Continue doing brain stimulating activities (puzzles, reading, adult coloring books, staying active) to keep memory sharp.   Bring a copy of your living will and/or healthcare power of attorney to your next office visit.    RE: MyChart  Dear Haley Huerta  We are excited to introduce MyChart, a new best-in-class service that provides you online access to important information in your electronic medical record. We want to make it easier for you to view your health information - all in one secure location - when and where you need it. We expect MyChart will enhance the quality of care and service we provide. Use the activation code below to enroll in MyChart online at [email or web address]  When you register for MyChart, you can:  Marland Kitchen View your test results. . Communicate securely with your physician's office.  . View your medical history, allergies, medications, and immunizations. . Conveniently print information such as your medication lists.  If you are age 69 or older and want a member of your family to have access to your record, you must provide written consent by completing a proxy form available at our facility. Please speak to our clinical staff about guidelines regarding accounts for patients younger than age 98.  As you activate your MyChart account and need any technical assistance, please call the MyChart technical support line at [phone]).  Thank you for using MyChart as your new health and wellness resource!  MyChart Activation Code:  [email or web address] Expires: 11/30/2018 10:41 AM           [facility]  [place], [place] [address] Health Maintenance, Female Adopting a healthy lifestyle and getting preventive care can go a long way to promote health and wellness. Talk with your health care provider about what schedule of regular examinations is right for you. This is a good chance for you to check in with your provider about disease  prevention and staying healthy. In between checkups, there are plenty of things you can do on your own. Experts have done a lot of research about which lifestyle changes and preventive measures are most likely to keep you healthy. Ask your health care provider for more information. Weight and diet Eat a healthy diet  Be sure to include plenty of vegetables, fruits, low-fat dairy products, and lean protein.  Do not eat a lot of foods high in solid fats, added sugars, or salt.  Get regular exercise. This is one of the most important things you can do for your health. ? Most adults should exercise for at least 150 minutes each week. The exercise should increase your heart rate and make you sweat (moderate-intensity exercise). ? Most adults should also do strengthening exercises at least twice a week. This is in addition to the moderate-intensity exercise.  Maintain a healthy weight  Body mass index (BMI) is a measurement that can be used to identify possible weight problems. It estimates body fat based on height and weight. Your health care provider can help determine your BMI and help you achieve or maintain a healthy weight.  For females 31 years of age and older: ? A BMI below 18.5 is considered underweight. ? A BMI of 18.5 to 24.9 is normal. ? A BMI of 25 to 29.9 is considered overweight. ? A BMI of 30 and above is considered obese.  Watch levels of cholesterol and blood lipids  You should start having your blood tested for lipids and cholesterol at 69 years of age, then have  this test every 5 years.  You may need to have your cholesterol levels checked more often if: ? Your lipid or cholesterol levels are high. ? You are older than 69 years of age. ? You are at high risk for heart disease.  Cancer screening Lung Cancer  Lung cancer screening is recommended for adults 61-5 years old who are at high risk for lung cancer because of a history of smoking.  A yearly low-dose CT scan  of the lungs is recommended for people who: ? Currently smoke. ? Have quit within the past 15 years. ? Have at least a 30-pack-year history of smoking. A pack year is smoking an average of one pack of cigarettes a day for 1 year.  Yearly screening should continue until it has been 15 years since you quit.  Yearly screening should stop if you develop a health problem that would prevent you from having lung cancer treatment.  Breast Cancer  Practice breast self-awareness. This means understanding how your breasts normally appear and feel.  It also means doing regular breast self-exams. Let your health care provider know about any changes, no matter how small.  If you are in your 20s or 30s, you should have a clinical breast exam (CBE) by a health care provider every 1-3 years as part of a regular health exam.  If you are 80 or older, have a CBE every year. Also consider having a breast X-ray (mammogram) every year.  If you have a family history of breast cancer, talk to your health care provider about genetic screening.  If you are at high risk for breast cancer, talk to your health care provider about having an MRI and a mammogram every year.  Breast cancer gene (BRCA) assessment is recommended for women who have family members with BRCA-related cancers. BRCA-related cancers include: ? Breast. ? Ovarian. ? Tubal. ? Peritoneal cancers.  Results of the assessment will determine the need for genetic counseling and BRCA1 and BRCA2 testing.  Cervical Cancer Your health care provider may recommend that you be screened regularly for cancer of the pelvic organs (ovaries, uterus, and vagina). This screening involves a pelvic examination, including checking for microscopic changes to the surface of your cervix (Pap test). You may be encouraged to have this screening done every 3 years, beginning at age 75.  For women ages 80-65, health care providers may recommend pelvic exams and Pap testing  every 3 years, or they may recommend the Pap and pelvic exam, combined with testing for human papilloma virus (HPV), every 5 years. Some types of HPV increase your risk of cervical cancer. Testing for HPV may also be done on women of any age with unclear Pap test results.  Other health care providers may not recommend any screening for nonpregnant women who are considered low risk for pelvic cancer and who do not have symptoms. Ask your health care provider if a screening pelvic exam is right for you.  If you have had past treatment for cervical cancer or a condition that could lead to cancer, you need Pap tests and screening for cancer for at least 20 years after your treatment. If Pap tests have been discontinued, your risk factors (such as having a new sexual partner) need to be reassessed to determine if screening should resume. Some women have medical problems that increase the chance of getting cervical cancer. In these cases, your health care provider may recommend more frequent screening and Pap tests.  Colorectal Cancer  This  type of cancer can be detected and often prevented.  Routine colorectal cancer screening usually begins at 69 years of age and continues through 69 years of age.  Your health care provider may recommend screening at an earlier age if you have risk factors for colon cancer.  Your health care provider may also recommend using home test kits to check for hidden blood in the stool.  A small camera at the end of a tube can be used to examine your colon directly (sigmoidoscopy or colonoscopy). This is done to check for the earliest forms of colorectal cancer.  Routine screening usually begins at age 58.  Direct examination of the colon should be repeated every 5-10 years through 69 years of age. However, you may need to be screened more often if early forms of precancerous polyps or small growths are found.  Skin Cancer  Check your skin from head to toe  regularly.  Tell your health care provider about any new moles or changes in moles, especially if there is a change in a mole's shape or color.  Also tell your health care provider if you have a mole that is larger than the size of a pencil eraser.  Always use sunscreen. Apply sunscreen liberally and repeatedly throughout the day.  Protect yourself by wearing long sleeves, pants, a wide-brimmed hat, and sunglasses whenever you are outside.  Heart disease, diabetes, and high blood pressure  High blood pressure causes heart disease and increases the risk of stroke. High blood pressure is more likely to develop in: ? People who have blood pressure in the high end of the normal range (130-139/85-89 mm Hg). ? People who are overweight or obese. ? People who are African American.  If you are 17-55 years of age, have your blood pressure checked every 3-5 years. If you are 72 years of age or older, have your blood pressure checked every year. You should have your blood pressure measured twice-once when you are at a hospital or clinic, and once when you are not at a hospital or clinic. Record the average of the two measurements. To check your blood pressure when you are not at a hospital or clinic, you can use: ? An automated blood pressure machine at a pharmacy. ? A home blood pressure monitor.  If you are between 70 years and 60 years old, ask your health care provider if you should take aspirin to prevent strokes.  Have regular diabetes screenings. This involves taking a blood sample to check your fasting blood sugar level. ? If you are at a normal weight and have a low risk for diabetes, have this test once every three years after 69 years of age. ? If you are overweight and have a high risk for diabetes, consider being tested at a younger age or more often. Preventing infection Hepatitis B  If you have a higher risk for hepatitis B, you should be screened for this virus. You are considered  at high risk for hepatitis B if: ? You were born in a country where hepatitis B is common. Ask your health care provider which countries are considered high risk. ? Your parents were born in a high-risk country, and you have not been immunized against hepatitis B (hepatitis B vaccine). ? You have HIV or AIDS. ? You use needles to inject street drugs. ? You live with someone who has hepatitis B. ? You have had sex with someone who has hepatitis B. ? You get hemodialysis treatment. ?  You take certain medicines for conditions, including cancer, organ transplantation, and autoimmune conditions.  Hepatitis C  Blood testing is recommended for: ? Everyone born from [birth year] through 1967. ? Anyone with known risk factors for hepatitis C.  Sexually transmitted infections (STIs)  You should be screened for sexually transmitted infections (STIs) including gonorrhea and chlamydia if: ? You are sexually active and are younger than 69 years of age. ? You are older than 69 years of age and your health care provider tells you that you are at risk for this type of infection. ? Your sexual activity has changed since you were last screened and you are at an increased risk for chlamydia or gonorrhea. Ask your health care provider if you are at risk.  If you do not have HIV, but are at risk, it may be recommended that you take a prescription medicine daily to prevent HIV infection. This is called pre-exposure prophylaxis (PrEP). You are considered at risk if: ? You are sexually active and do not regularly use condoms or know the HIV status of your partner(s). ? You take drugs by injection. ? You are sexually active with a partner who has HIV.  Talk with your health care provider about whether you are at high risk of being infected with HIV. If you choose to begin PrEP, you should first be tested for HIV. You should then be tested every 3 months for as long as you are taking PrEP. Pregnancy  If you are  premenopausal and you may become pregnant, ask your health care provider about preconception counseling.  If you may become pregnant, take 400 to 800 micrograms (mcg) of folic acid every day.  If you want to prevent pregnancy, talk to your health care provider about birth control (contraception). Osteoporosis and menopause  Osteoporosis is a disease in which the bones lose minerals and strength with aging. This can result in serious bone fractures. Your risk for osteoporosis can be identified using a bone density scan.  If you are 59 years of age or older, or if you are at risk for osteoporosis and fractures, ask your health care provider if you should be screened.  Ask your health care provider whether you should take a calcium or vitamin D supplement to lower your risk for osteoporosis.  Menopause may have certain physical symptoms and risks.  Hormone replacement therapy may reduce some of these symptoms and risks. Talk to your health care provider about whether hormone replacement therapy is right for you. Follow these instructions at home:  Schedule regular health, dental, and eye exams.  Stay current with your immunizations.  Do not use any tobacco products including cigarettes, chewing tobacco, or electronic cigarettes.  If you are pregnant, do not drink alcohol.  If you are breastfeeding, limit how much and how often you drink alcohol.  Limit alcohol intake to no more than 1 drink per day for nonpregnant women. One drink equals 12 ounces of beer, 5 ounces of wine, or 1 ounces of hard liquor.  Do not use street drugs.  Do not share needles.  Ask your health care provider for help if you need support or information about quitting drugs.  Tell your health care provider if you often feel depressed.  Tell your health care provider if you have ever been abused or do not feel safe at home. This information is not intended to replace advice given to you by your health care  provider. Make sure you discuss any questions you  have with your health care provider. Document Released: 05/29/2011 Document Revised: 04/20/2016 Document Reviewed: 08/17/2015 Elsevier Interactive Patient Education  [employer].

## 2018-10-17 ENCOUNTER — Telehealth: Payer: Self-pay

## 2018-10-17 LAB — HEPATITIS C ANTIBODY
Hepatitis C Ab: NONREACTIVE
SIGNAL TO CUT-OFF: 0.01 (ref <1.00)

## 2018-10-17 NOTE — Telephone Encounter (Signed)
Pt. Is scheduled for Prolia injection 10/18/18

## 2018-10-18 ENCOUNTER — Ambulatory Visit (INDEPENDENT_AMBULATORY_CARE_PROVIDER_SITE_OTHER): Payer: Medicare Other

## 2018-10-18 DIAGNOSIS — M810 Age-related osteoporosis without current pathological fracture: Secondary | ICD-10-CM | POA: Diagnosis not present

## 2018-10-18 MED ORDER — DENOSUMAB 60 MG/ML ~~LOC~~ SOSY
60.0000 mg | PREFILLED_SYRINGE | Freq: Once | SUBCUTANEOUS | Status: AC
  Administered 2018-10-18: 60 mg via SUBCUTANEOUS

## 2018-10-18 NOTE — Progress Notes (Signed)
Administered Prolia injection into right arm SubQ. Pt. Tolerated well. Scheduled for next injection May 2020

## 2018-10-19 LAB — PAIN MGMT, PROFILE 8 W/CONF, U
6 Acetylmorphine: NEGATIVE ng/mL (ref <10)
ALCOHOL METABOLITES: NEGATIVE ng/mL (ref <500)
ALPHAHYDROXYALPRAZOLAM: NEGATIVE ng/mL (ref <25)
ALPHAHYDROXYMIDAZOLAM: NEGATIVE ng/mL (ref <50)
Alphahydroxytriazolam: NEGATIVE ng/mL (ref <50)
Aminoclonazepam: NEGATIVE ng/mL (ref <25)
Amphetamines: NEGATIVE ng/mL (ref <500)
BUPRENORPHINE, URINE: NEGATIVE ng/mL (ref <5)
Benzodiazepines: POSITIVE ng/mL — AB (ref <100)
COCAINE METABOLITE: NEGATIVE ng/mL (ref <150)
Creatinine: 47.7 mg/dL
HYDROXYETHYLFLURAZEPAM: NEGATIVE ng/mL (ref <50)
LORAZEPAM: 411 ng/mL — AB (ref <50)
MARIJUANA METABOLITE: NEGATIVE ng/mL (ref <20)
MDMA: NEGATIVE ng/mL (ref <500)
Nordiazepam: NEGATIVE ng/mL (ref <50)
Opiates: NEGATIVE ng/mL (ref <100)
Oxazepam: NEGATIVE ng/mL (ref <50)
Oxidant: NEGATIVE ug/mL (ref <200)
Oxycodone: NEGATIVE ng/mL (ref <100)
Temazepam: NEGATIVE ng/mL (ref <50)
pH: 6.62 (ref 4.5–9.0)

## 2018-11-04 ENCOUNTER — Telehealth: Payer: Self-pay

## 2018-11-04 NOTE — Telephone Encounter (Signed)
Vitamin D was ordered after other labs were released, it was not drawn. I can call patient to return to have lab performed.

## 2018-11-04 NOTE — Telephone Encounter (Signed)
-----   Message from Leamon Arnt, MD sent at 11/04/2018 11:05 AM EST ----- Can you please check on this vit D lab? It hasn't been resulted.   Thanks, Dr. Jonni Sanger  ----- Message ----- From: SYSTEM Sent: 10/21/2018  12:09 AM EST To: Leamon Arnt, MD

## 2018-11-04 NOTE — Telephone Encounter (Signed)
Yes please. Because she is on Prolia, I need to make sure her vit D levels are in the normal range. Thanks.

## 2018-11-06 NOTE — Telephone Encounter (Signed)
Pt scheduled for Vit D lab Friday 11/08/18

## 2018-11-08 ENCOUNTER — Other Ambulatory Visit: Payer: Medicare Other

## 2018-11-11 ENCOUNTER — Other Ambulatory Visit (INDEPENDENT_AMBULATORY_CARE_PROVIDER_SITE_OTHER): Payer: Medicare Other

## 2018-11-11 DIAGNOSIS — E559 Vitamin D deficiency, unspecified: Secondary | ICD-10-CM | POA: Diagnosis not present

## 2018-11-11 LAB — VITAMIN D 25 HYDROXY (VIT D DEFICIENCY, FRACTURES): VITD: 24.62 ng/mL — ABNORMAL LOW (ref 30.00–100.00)

## 2018-11-12 ENCOUNTER — Encounter: Payer: Self-pay | Admitting: Family Medicine

## 2018-11-22 ENCOUNTER — Other Ambulatory Visit: Payer: Self-pay

## 2018-11-22 ENCOUNTER — Encounter: Payer: Self-pay | Admitting: Family Medicine

## 2018-11-22 ENCOUNTER — Ambulatory Visit (INDEPENDENT_AMBULATORY_CARE_PROVIDER_SITE_OTHER): Payer: Medicare Other | Admitting: Family Medicine

## 2018-11-22 VITALS — BP 126/74 | HR 72 | Temp 99.3°F | Resp 16 | Ht 64.0 in | Wt 121.0 lb

## 2018-11-22 DIAGNOSIS — Z79899 Other long term (current) drug therapy: Secondary | ICD-10-CM | POA: Diagnosis not present

## 2018-11-22 DIAGNOSIS — J040 Acute laryngitis: Secondary | ICD-10-CM

## 2018-11-22 DIAGNOSIS — M47812 Spondylosis without myelopathy or radiculopathy, cervical region: Secondary | ICD-10-CM

## 2018-11-22 MED ORDER — TRAMADOL HCL 50 MG PO TABS: 50.0000 mg | ORAL_TABLET | Freq: Four times a day (QID) | ORAL | 2 refills | Status: DC

## 2018-11-22 MED ORDER — LORAZEPAM 1 MG PO TABS: 1.0000 mg | ORAL_TABLET | Freq: Every day | ORAL | 0 refills | Status: DC

## 2018-11-22 MED ORDER — OLOPATADINE HCL 0.2 % OP SOLN: 1.0000 [drp] | Freq: Every day | OPHTHALMIC | 5 refills | Status: DC

## 2018-11-22 NOTE — Patient Instructions (Signed)
Please follow up if symptoms do not improve or as needed.   

## 2018-11-22 NOTE — Progress Notes (Signed)
Subjective   CC:  Chief Complaint  Patient presents with  . Hoarse    Started 2 weeks ago.. Reports neck swelling.. Had head cold symptoms for 2 days.. Taking OTC mucous medication    HPI: Haley Huerta is a 67 y.o. female who presents to the office today to address the problems listed above in the chief complaint.  Patient complains of typical URI symptoms including nasal congestion, mild sore throat, cough, and mild malaise.  The symptoms have been present for about 2 weeks although they have changed: started with the sore throat, then had URI congestion and cough; these things have resolved but now with hoarseness, throat clearing and PND. No fevers, malaise, myalgias, cough, sob or gi sxs.  Over-the-counter cold medicines have been minimally or mildly helpful. Using tylenol sinus.   Will need refills on ativan and tramadol for chronic use: see PL. Will be due in march and next visit here is in may.   Assessment  1. Laryngitis   2. Chronic use of benzodiazepine for therapeutic purpose   3. Spondylosis of cervical region without myelopathy or radiculopathy      Plan   URI, viral: discussed dx; no sign or sx of bacterial infection is present. Treat supportively with antihistamines, decongestants, and/or cough meds. See AVS for care instructions.   Refilled controlled meds; printed for start date in march when due.   Follow up: as scheduled.  04/16/2019    No orders of the defined types were placed in this encounter.  Meds ordered this encounter  Medications  . traMADol (ULTRAM) 50 MG tablet    Sig: Take 1 tablet (50 mg total) by mouth 4 (four) times daily.    Dispense:  120 tablet    Refill:  2  . LORazepam (ATIVAN) 1 MG tablet    Sig: Take 1 tablet (1 mg total) by mouth at bedtime.    Dispense:  90 tablet    Refill:  0  . Olopatadine HCl 0.2 % SOLN    Sig: Apply 1 drop to eye daily.    Dispense:  2.5 mL    Refill:  5      I reviewed the patients updated  PMH, FH, and SocHx.    Patient Active Problem List   Diagnosis Date Noted  . 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 History of colon cancer- cecal adenocarcinoma 08/23/2018  . S/P left hemicolectomy 08/23/2018  . DJD (degenerative joint disease) of cervical spine 08/23/2018  . Macular degeneration 08/23/2018  . Osteoporosis 08/23/2018  . Chronic use of benzodiazepine for therapeutic purpose 08/23/2018  . History of ulcerative colitis 08/23/2018   Current Meds  Medication Sig  . denosumab (PROLIA) 60 MG/ML SOSY injection Inject 60 mg into the skin every 6 (six) months.  . diphenhydrAMINE (BENADRYL) 25 MG tablet Take 25 mg by mouth every 6 (six) hours as needed.  . fluticasone (FLONASE) 50 MCG/ACT nasal spray Place into both nostrils daily.  Marland Kitchen ibuprofen (ADVIL,MOTRIN) 200 MG tablet Take 200 mg by mouth every 6 (six) hours as needed.  Derrill Memo ON 02/24/2019] LORazepam (ATIVAN) 1 MG tablet Take 1 tablet (1 mg total) by mouth at bedtime.  . Multiple Vitamins-Minerals (PRESERVISION AREDS PO) Take by mouth.  . NON FORMULARY 1 % every morning.  . NON FORMULARY daily.  Derrill Memo ON 02/24/2019] traMADol (ULTRAM) 50 MG tablet Take 1 tablet (50 mg total) by mouth 4 (four) times daily.  . [DISCONTINUED] LORazepam (ATIVAN) 1  MG tablet Take 1 tablet (1 mg total) by mouth at bedtime.  . [DISCONTINUED] traMADol (ULTRAM) 50 MG tablet Take 1 tablet (50 mg total) by mouth 4 (four) times daily.    Review of Systems: Constitutional: Negative for fever malaise or anorexia Cardiovascular: negative for chest pain Respiratory: negative for SOB or pleuritic chest pain Gastrointestinal: negative for abdominal pain  Objective  Vitals: BP 126/74   Pulse 72   Temp 99.3 F (37.4 C) (Oral)   Resp 16   Ht 5\' 4"  (1.626 m)   Wt 121 lb (54.9 kg)   SpO2 98%   BMI 20.77 kg/m  General: no acute respiratory distress, mild hoarseness Psych:  Alert and oriented, normal mood and affect HEENT:  Normocephalic, nasal congestion present, TMs w/o erythema, OP without erythema w/o exudate, + mild nontender cervical LAD, supple neck  Cardiovascular:  RRR without murmur or gallop. no peripheral edema Respiratory:  Good breath sounds bilaterally, CTAB with normal respiratory effort Skin:  Warm, no rashes Neurologic:   Mental status is normal. normal gait  Commons side effects, risks, benefits, and alternatives for medications and treatment plan prescribed today were discussed, and the patient expressed understanding of the given instructions. Patient is instructed to call or message via MyChart if he/she has any questions or concerns regarding our treatment plan. No barriers to understanding were identified. We discussed Red Flag symptoms and signs in detail. Patient expressed understanding regarding what to do in case of urgent or emergency type symptoms.   Medication list was reconciled, printed and provided to the patient in AVS. Patient instructions and summary information was reviewed with the patient as documented in the AVS. This note was prepared with assistance of Dragon voice recognition software. Occasional wrong-word or sound-a-like substitutions may have occurred due to the inherent limitations of voice recognition software

## 2018-12-06 DIAGNOSIS — H353221 Exudative age-related macular degeneration, left eye, with active choroidal neovascularization: Secondary | ICD-10-CM | POA: Diagnosis not present

## 2018-12-06 DIAGNOSIS — H43813 Vitreous degeneration, bilateral: Secondary | ICD-10-CM | POA: Diagnosis not present

## 2018-12-06 DIAGNOSIS — H2513 Age-related nuclear cataract, bilateral: Secondary | ICD-10-CM | POA: Diagnosis not present

## 2018-12-06 DIAGNOSIS — H353111 Nonexudative age-related macular degeneration, right eye, early dry stage: Secondary | ICD-10-CM | POA: Diagnosis not present

## 2019-02-10 ENCOUNTER — Ambulatory Visit (INDEPENDENT_AMBULATORY_CARE_PROVIDER_SITE_OTHER): Payer: Medicare Other | Admitting: Physician Assistant

## 2019-02-10 ENCOUNTER — Encounter: Payer: Self-pay | Admitting: Physician Assistant

## 2019-02-10 ENCOUNTER — Other Ambulatory Visit: Payer: Self-pay

## 2019-02-10 ENCOUNTER — Ambulatory Visit: Payer: Medicare Other | Admitting: Physician Assistant

## 2019-02-10 VITALS — BP 98/50 | HR 81 | Temp 98.5°F | Resp 16 | Ht 64.0 in | Wt 115.0 lb

## 2019-02-10 DIAGNOSIS — J208 Acute bronchitis due to other specified organisms: Secondary | ICD-10-CM

## 2019-02-10 DIAGNOSIS — B9689 Other specified bacterial agents as the cause of diseases classified elsewhere: Secondary | ICD-10-CM | POA: Diagnosis not present

## 2019-02-10 MED ORDER — AMOXICILLIN-POT CLAVULANATE 875-125 MG PO TABS: 1.0000 | ORAL_TABLET | Freq: Two times a day (BID) | ORAL | 0 refills | Status: DC

## 2019-02-10 MED ORDER — HYDROCOD POLST-CPM POLST ER 10-8 MG/5ML PO SUER: 5.0000 mL | Freq: Two times a day (BID) | ORAL | 0 refills | Status: DC | PRN

## 2019-02-10 NOTE — Progress Notes (Signed)
Patient presents to clinic today c/o 2 weeks of chest congestion and cough that is productive of thick clear/white sputum. Denies fever, chills. Notes fatigue and anorexia. Is hydrating well. Denies nausea/vomiting, chest pain or SOB. Denies sinus pain or ear pain. Denies recent travel or sick contact. Is taking Mucinex. Symptoms have been gradually worsening since onset.    Past Medical History:  Diagnosis Date  . Allergy   . Arthritis   . 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  . Colon cancer ([place]) 2001  . 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 GERD (gastroesophageal reflux disease)   . History of ulcerative colitis 08/23/2018  . Osteoporosis 08/23/2018   Dexa 08/2017 lowest T=-2.8; vit d and calcium; rec tx, pt to think about it.     Current Outpatient Medications on File Prior to Visit  Medication Sig Dispense Refill  . denosumab (PROLIA) 60 MG/ML SOSY injection Inject 60 mg into the skin every 6 (six) months.    . diphenhydrAMINE (BENADRYL) 25 MG tablet Take 25 mg by mouth every 6 (six) hours as needed.    . fluticasone (FLONASE) 50 MCG/ACT nasal spray Place into both nostrils daily.    Marland Kitchen ibuprofen (ADVIL,MOTRIN) 200 MG tablet Take 200 mg by mouth every 6 (six) hours as needed.    Derrill Memo ON 02/24/2019] LORazepam (ATIVAN) 1 MG tablet Take 1 tablet (1 mg total) by mouth at bedtime. 90 tablet 0  . Multiple Vitamins-Minerals (PRESERVISION AREDS PO) Take by mouth.    . NON FORMULARY 1 % every morning.    . NON FORMULARY daily.    . Olopatadine HCl 0.2 % SOLN Apply 1 drop to eye daily. 2.5 mL 5  . [START ON 02/24/2019] traMADol (ULTRAM) 50 MG tablet Take 1 tablet (50 mg total) by mouth 4 (four) times daily. 120 tablet 2   No current facility-administered medications on file prior to visit.     Allergies  Allergen Reactions   . Phenergan [Promethazine Hcl] Other (See Comments)    Restless legs    Family History  Problem Relation Age of Onset  . Arthritis Mother   . Stroke Mother   . Prostate cancer Father   . Congestive Heart Failure Father   . Hypertension Father   . Restless legs syndrome Father   . Depression Son   . Restless legs syndrome Brother     Social History   Socioeconomic History  . Marital status: Divorced    Spouse name: Not on file  . Number of children: 1  . Years of education: Not on file  . Highest education level: Not on file  Occupational History  . Occupation: retired, Quarry manager etc  Social Needs  . Financial resource strain: Not on file  . Food insecurity:    Worry: Not on file    Inability: Not on file  . Transportation needs:    Medical: Not on file    Non-medical: Not on file  Tobacco Use  . Smoking status: Never Smoker  . Smokeless tobacco: Never Used  Substance and Sexual Activity  . Alcohol use: Yes    Comment: rarely   . Drug use: Never  . Sexual activity: Not Currently    Birth control/protection: Post-menopausal  Lifestyle  . Physical activity:    Days per week: Not on file  Minutes per session: Not on file  . Stress: Not on file  Relationships  . Social connections:    Talks on phone: Not on file    Gets together: Not on file    Attends religious service: Not on file    Active member of club or organization: Not on file    Attends meetings of clubs or organizations: Not on file    Relationship status: Not on file  Other Topics Concern  . Not on file  Social History Narrative  . Not on file   Review of Systems - See HPI.  All other ROS are negative.  BP (!) 98/50   Pulse 81   Temp 98.5 F (36.9 C) (Oral)   Resp 16   Ht 5\' 4"  (1.626 m)   Wt 115 lb (52.2 kg)   SpO2 96%   BMI 19.74 kg/m   Physical Exam Vitals signs reviewed.  Constitutional:      Appearance: Normal appearance.  HENT:     Head: Normocephalic and  atraumatic.     Right Ear: Tympanic membrane normal.     Left Ear: Tympanic membrane normal.     Nose: Nose normal.     Mouth/Throat:     Mouth: Mucous membranes are moist.  Eyes:     Conjunctiva/sclera: Conjunctivae normal.  Neck:     Musculoskeletal: Neck supple.  Cardiovascular:     Rate and Rhythm: Normal rate and regular rhythm.     Pulses: Normal pulses.     Heart sounds: Normal heart sounds.  Pulmonary:     Effort: Pulmonary effort is normal.     Breath sounds: Normal breath sounds.  Neurological:     General: No focal deficit present.     Mental Status: She is alert.  Psychiatric:        Mood and Affect: Mood normal.     Assessment/Plan: 1. Acute bacterial bronchitis Rx Augmentin.  Increase fluids.  Rest.  Saline nasal spray.  Probiotic.  Mucinex as directed.  Humidifier in bedroom. Tussionex per orders.  Call or return to clinic if symptoms are not improving.    Leeanne Rio, PA-C

## 2019-02-10 NOTE — Patient Instructions (Signed)
Take antibiotic (Augmentin) as directed.  Increase fluids.  Get plenty of rest. Use Mucinex for congestion. Tussionex for cough. Take a daily probiotic (I recommend Align or Culturelle, but even Activia Yogurt may be beneficial).  A humidifier placed in the bedroom may offer some relief for a dry, scratchy throat of nasal irritation.  Read information below on acute bronchitis. Please call or return to clinic if symptoms are not improving.  Acute Bronchitis Bronchitis is when the airways that extend from the windpipe into the lungs get red, puffy, and painful (inflamed). Bronchitis often causes thick spit (mucus) to develop. This leads to a cough. A cough is the most common symptom of bronchitis. In acute bronchitis, the condition usually begins suddenly and goes away over time (usually in 2 weeks). Smoking, allergies, and asthma can make bronchitis worse. Repeated episodes of bronchitis may cause more lung problems.  HOME CARE  Rest.  Drink enough fluids to keep your pee (urine) clear or pale yellow (unless you need to limit fluids as told by your doctor).  Only take over-the-counter or prescription medicines as told by your doctor.  Avoid smoking and secondhand smoke. These can make bronchitis worse. If you are a smoker, think about using nicotine gum or skin patches. Quitting smoking will help your lungs heal faster.  Reduce the chance of getting bronchitis again by:  Washing your hands often.  Avoiding people with cold symptoms.  Trying not to touch your hands to your mouth, nose, or eyes.  Follow up with your doctor as told.  GET HELP IF: Your symptoms do not improve after 1 week of treatment. Symptoms include:  Cough.  Fever.  Coughing up thick spit.  Body aches.  Chest congestion.  Chills.  Shortness of breath.  Sore throat.  GET HELP RIGHT AWAY IF:   You have an increased fever.  You have chills.  You have severe shortness of breath.  You have bloody thick  spit (sputum).  You throw up (vomit) often.  You lose too much body fluid (dehydration).  You have a severe headache.  You faint.  MAKE SURE YOU:   Understand these instructions.  Will watch your condition.  Will get help right away if you are not doing well or get worse. Document Released: 05/01/2008 Document Revised: 07/16/2013 Document Reviewed: 05/06/2013 [facility] Patient Information 2015 [place], [place]. This information is not intended to replace advice given to you by your health care provider. Make sure you discuss any questions you have with your health care provider.

## 2019-03-04 DIAGNOSIS — H353221 Exudative age-related macular degeneration, left eye, with active choroidal neovascularization: Secondary | ICD-10-CM | POA: Diagnosis not present

## 2019-04-16 ENCOUNTER — Encounter: Payer: Self-pay | Admitting: Family Medicine

## 2019-04-16 ENCOUNTER — Telehealth: Payer: Self-pay | Admitting: Family Medicine

## 2019-04-16 ENCOUNTER — Ambulatory Visit (INDEPENDENT_AMBULATORY_CARE_PROVIDER_SITE_OTHER): Payer: Medicare Other | Admitting: Family Medicine

## 2019-04-16 ENCOUNTER — Other Ambulatory Visit: Payer: Self-pay

## 2019-04-16 VITALS — Wt 125.0 lb

## 2019-04-16 DIAGNOSIS — M47812 Spondylosis without myelopathy or radiculopathy, cervical region: Secondary | ICD-10-CM | POA: Diagnosis not present

## 2019-04-16 DIAGNOSIS — M810 Age-related osteoporosis without current pathological fracture: Secondary | ICD-10-CM

## 2019-04-16 DIAGNOSIS — Z79899 Other long term (current) drug therapy: Secondary | ICD-10-CM | POA: Diagnosis not present

## 2019-04-16 DIAGNOSIS — G4701 Insomnia due to medical condition: Secondary | ICD-10-CM

## 2019-04-16 DIAGNOSIS — G8929 Other chronic pain: Secondary | ICD-10-CM | POA: Diagnosis not present

## 2019-04-16 MED ORDER — LORAZEPAM 1 MG PO TABS: 1.0000 mg | ORAL_TABLET | Freq: Two times a day (BID) | ORAL | 5 refills | Status: DC | PRN

## 2019-04-16 NOTE — Telephone Encounter (Signed)
Scheduled pt for her CPE this year. Pt stated she was also supposed to schedule for Prolia, When does pt need to be scheduled for Prolia?

## 2019-04-16 NOTE — Assessment & Plan Note (Signed)
Discussed risk versus benefits of Prolia injections.  Will return for her second injection.  Reassured.

## 2019-04-16 NOTE — Assessment & Plan Note (Signed)
Chronic pain, fairly well controlled on Ultram and muscle relaxers as needed.  Refill today.

## 2019-04-16 NOTE — Progress Notes (Signed)
TELEPHONE ENCOUNTER   Patient verbally agreed to telephone visit and is aware that copayment and coinsurance may apply. Patient was treated using telemedicine according to accepted telemedicine protocols.  Location of the patient: home Location of provider: [place], [place] office Names of all persons participating in the telemedicine service and role in the encounter: Leamon Arnt, MD    Subjective  CC:  Chief Complaint  Patient presents with  . Osteoporosis    Wants to discuss getting prolia during the pandemic  . Pain management    Taking Tramadol and Lorazepam, states she has had a few times that she has taking the Lorazepam twice    HPI: Haley Huerta is a 68 y.o. female who was telephoned today to address the problems listed above in the chief complaint.  Chronic pain management for cervical spondylosis, chronic benzo management for insomnia secondary to pain in muscle tension: Patient reports that she is doing okay but pain is been mildly worse, more of a burning pain due to muscle tension in her neck.  She has been more stressed due to [place] pandemic.  Feels more worried during this time.  This is affected her pain control.  She has used a handful of extra Ativan over the course of the last 3 months.  She is used this and she is felt very stressed.  We discussed her chronic benzo use.  She has been on this since 2003.  She reports she is failed tricyclics, Wellbutrin and muscle relaxers in the past.  She does admit to stressors but does not feel that she has an anxiety or depressive disorder.  She is unwilling to try SSRIs at this time.  Did discuss risks on increasing dosages of benzos.  I did review her drug database and it is appropriate.  She denies new symptoms.  Osteoporosis: Started Prolia injections and November.  She is due for her second injection.  She is tolerating it well but question whether it decreases her immune system. ASSESSMENT: 1.  Spondylosis of cervical region without myelopathy or radiculopathy   2. Chronic use of benzodiazepine for therapeutic purpose   3. Insomnia secondary to chronic pain   4. Age-related osteoporosis without current pathological fracture      See below for problem based assessment and plan documentation  Time spent with the patient (non face-to-face time during this virtual encounter): 25 minutes, spent in obtaining information about her symptoms, reviewing her previous labs, evaluations, and treatments, counseling her about her condition (please see the discussed topics above), and developing a plan to further investigate it; the patient was provided an opportunity to ask questions and all were answered. The patient agreed with the plan and demonstrated an understanding of the instructions.   The patient was advised to call back or seek an in-person evaluation if the symptoms worsen or if the condition fails to improve as anticipated.  Follow up: Return in about 6 months (around 10/17/2019) for complete physical, pain management f/u.  Return for Prolia injection in the next 1 to 2 weeks. Visit date not found  No orders of the defined types were placed in this encounter.  Meds ordered this encounter  Medications  . LORazepam (ATIVAN) 1 MG tablet    Sig: Take 1 tablet (1 mg total) by mouth 2 (two) times daily as needed for anxiety. Please hold for next refill due.    Dispense:  100 tablet    Refill:  5  I reviewed the patients updated PMH, FH, and SocHx.    Patient Active Problem List   Diagnosis Date Noted  . 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 History of colon cancer- cecal adenocarcinoma 08/23/2018    Priority: High  . S/P left hemicolectomy 08/23/2018    Priority: High  . DJD (degenerative joint disease) of cervical spine 08/23/2018    Priority: High  . Osteoporosis 08/23/2018    Priority: High  . Chronic use of benzodiazepine for therapeutic  purpose 08/23/2018    Priority: High  . History of ulcerative colitis 08/23/2018    Priority: Medium  . Macular degeneration 08/23/2018    Priority: Low   Current Meds  Medication Sig  . denosumab (PROLIA) 60 MG/ML SOSY injection Inject 60 mg into the skin every 6 (six) months.  . diphenhydrAMINE (BENADRYL) 25 MG tablet Take 25 mg by mouth every 6 (six) hours as needed.  . fluticasone (FLONASE) 50 MCG/ACT nasal spray Place into both nostrils daily.  Marland Kitchen LORazepam (ATIVAN) 1 MG tablet Take 1 tablet (1 mg total) by mouth 2 (two) times daily as needed for anxiety. Please hold for next refill due.  . Multiple Vitamins-Minerals (PRESERVISION AREDS PO) Take by mouth.  . NON FORMULARY 1 % every morning.  . NON FORMULARY daily.  . Olopatadine HCl 0.2 % SOLN Apply 1 drop to eye daily.  . traMADol (ULTRAM) 50 MG tablet Take 1 tablet (50 mg total) by mouth 4 (four) times daily.  . [DISCONTINUED] LORazepam (ATIVAN) 1 MG tablet Take 1 tablet (1 mg total) by mouth at bedtime.    Allergies: Patient is allergic to phenergan [promethazine hcl]. Family History: Patient family history includes Arthritis in her mother; Congestive Heart Failure in her father; Depression in her son; Hypertension in her father; Prostate cancer in her father; Restless legs syndrome in her brother and father; Stroke in her mother. Social History:  Patient  reports that she has never smoked. She has never used smokeless tobacco. She reports current alcohol use. She reports that she does not use drugs.  Review of Systems: Constitutional: Negative for fever malaise or anorexia Cardiovascular: negative for chest pain Respiratory: negative for SOB or persistent cough Gastrointestinal: negative for abdominal pain

## 2019-04-16 NOTE — Assessment & Plan Note (Signed)
Chronic Ativan use: Risk and benefits discussed.  Refilled today with 10 extra pills prescribed for the next 3 months.  If patient needs increase further, will readdress need for SSRI or other stress management strategies.  Patient understands and agrees care plan.

## 2019-04-16 NOTE — Telephone Encounter (Signed)
I will call pt to get scheduled in 1-2 weeks; have ensure we have her medication is here first

## 2019-04-16 NOTE — Assessment & Plan Note (Signed)
Insomnia managed with chronic Ativan.  See above

## 2019-04-16 NOTE — Patient Instructions (Signed)
Please return in November for your annual complete physical; please come fasting.   If you have any questions or concerns, please don't hesitate to send me a message via MyChart or call the office at [phone]. Thank you for visiting with [place] today! It's our pleasure caring for you.

## 2019-04-18 ENCOUNTER — Ambulatory Visit: Payer: Medicare Other

## 2019-04-18 NOTE — Telephone Encounter (Signed)
I ordered a Prolia through our pharmacy for this pt, I have received it but I have not scheduled pt yet. I was unaware you handled Prolia until I talked to [place].

## 2019-04-23 ENCOUNTER — Ambulatory Visit: Payer: Medicare Other | Admitting: Family Medicine

## 2019-04-25 ENCOUNTER — Ambulatory Visit (INDEPENDENT_AMBULATORY_CARE_PROVIDER_SITE_OTHER): Payer: Medicare Other | Admitting: Family Medicine

## 2019-04-25 ENCOUNTER — Encounter: Payer: Self-pay | Admitting: Family Medicine

## 2019-04-25 ENCOUNTER — Other Ambulatory Visit: Payer: Self-pay

## 2019-04-25 DIAGNOSIS — M810 Age-related osteoporosis without current pathological fracture: Secondary | ICD-10-CM | POA: Diagnosis not present

## 2019-04-25 MED ORDER — DENOSUMAB 60 MG/ML ~~LOC~~ SOSY
60.0000 mg | PREFILLED_SYRINGE | Freq: Once | SUBCUTANEOUS | Status: AC
  Administered 2019-04-25: 60 mg via SUBCUTANEOUS

## 2019-04-25 NOTE — Progress Notes (Signed)
Haley Huerta is a 68 y.o. female presents to the office today for Prolia injection Original order: Prolia 60 mg/mL every 6 months Prolia (denosumab) (med), 60mg /mL (dose),  Subcutaneous (route) was administered right arm (location) today. Patient tolerated injection well.  Patient next injection due: November 2020 with [place]

## 2019-04-29 DIAGNOSIS — H353221 Exudative age-related macular degeneration, left eye, with active choroidal neovascularization: Secondary | ICD-10-CM | POA: Diagnosis not present

## 2019-04-30 NOTE — Progress Notes (Signed)
Reviewed and agree with information documented by T. Rosita Fire, CMA

## 2019-04-30 NOTE — Progress Notes (Signed)
Ok, I signed the addendum again. If you don't have what you need, then you will need to tell me what else to do. Thanks!

## 2019-06-18 DIAGNOSIS — H43813 Vitreous degeneration, bilateral: Secondary | ICD-10-CM | POA: Diagnosis not present

## 2019-06-18 DIAGNOSIS — H353111 Nonexudative age-related macular degeneration, right eye, early dry stage: Secondary | ICD-10-CM | POA: Diagnosis not present

## 2019-06-18 DIAGNOSIS — H353221 Exudative age-related macular degeneration, left eye, with active choroidal neovascularization: Secondary | ICD-10-CM | POA: Diagnosis not present

## 2019-06-18 DIAGNOSIS — H2513 Age-related nuclear cataract, bilateral: Secondary | ICD-10-CM | POA: Diagnosis not present

## 2019-07-02 ENCOUNTER — Other Ambulatory Visit: Payer: Self-pay | Admitting: Family Medicine

## 2019-07-02 MED ORDER — TRAMADOL HCL 50 MG PO TABS: 50.0000 mg | ORAL_TABLET | Freq: Four times a day (QID) | ORAL | 3 refills | Status: DC

## 2019-07-02 NOTE — Telephone Encounter (Signed)
Last OV:  04/16/2019 Next OV:  10/20/2019 Last Fill:  06/02/2019  Please advise.

## 2019-07-02 NOTE — Telephone Encounter (Signed)
Pt has an appt in nov 2020. Pt needs a refill tramadol 50 mg #120 with 3 refills until her app. [facility]

## 2019-07-29 DIAGNOSIS — Z23 Encounter for immunization: Secondary | ICD-10-CM | POA: Diagnosis not present

## 2019-08-20 DIAGNOSIS — H353111 Nonexudative age-related macular degeneration, right eye, early dry stage: Secondary | ICD-10-CM | POA: Diagnosis not present

## 2019-08-20 DIAGNOSIS — H353221 Exudative age-related macular degeneration, left eye, with active choroidal neovascularization: Secondary | ICD-10-CM | POA: Diagnosis not present

## 2019-08-20 DIAGNOSIS — H43811 Vitreous degeneration, right eye: Secondary | ICD-10-CM | POA: Diagnosis not present

## 2019-09-29 ENCOUNTER — Encounter: Payer: Self-pay | Admitting: *Deleted

## 2019-09-30 MED ORDER — TRAMADOL HCL 50 MG PO TABS: 50.0000 mg | ORAL_TABLET | Freq: Four times a day (QID) | ORAL | 2 refills | Status: DC

## 2019-10-20 ENCOUNTER — Encounter: Payer: Medicare Other | Admitting: Family Medicine

## 2019-10-29 DIAGNOSIS — H2513 Age-related nuclear cataract, bilateral: Secondary | ICD-10-CM | POA: Diagnosis not present

## 2019-10-29 DIAGNOSIS — H353111 Nonexudative age-related macular degeneration, right eye, early dry stage: Secondary | ICD-10-CM | POA: Diagnosis not present

## 2019-10-29 DIAGNOSIS — H43811 Vitreous degeneration, right eye: Secondary | ICD-10-CM | POA: Diagnosis not present

## 2019-10-29 DIAGNOSIS — H353221 Exudative age-related macular degeneration, left eye, with active choroidal neovascularization: Secondary | ICD-10-CM | POA: Diagnosis not present

## 2019-12-09 ENCOUNTER — Other Ambulatory Visit: Payer: Self-pay

## 2019-12-10 ENCOUNTER — Encounter: Payer: Self-pay | Admitting: Family Medicine

## 2019-12-10 ENCOUNTER — Ambulatory Visit (INDEPENDENT_AMBULATORY_CARE_PROVIDER_SITE_OTHER): Payer: Medicare Other | Admitting: Family Medicine

## 2019-12-10 VITALS — BP 144/80 | HR 69 | Temp 97.6°F | Ht 64.0 in | Wt 128.2 lb

## 2019-12-10 DIAGNOSIS — Z23 Encounter for immunization: Secondary | ICD-10-CM

## 2019-12-10 DIAGNOSIS — Z9049 Acquired absence of other specified parts of digestive tract: Secondary | ICD-10-CM

## 2019-12-10 DIAGNOSIS — M47812 Spondylosis without myelopathy or radiculopathy, cervical region: Secondary | ICD-10-CM | POA: Diagnosis not present

## 2019-12-10 DIAGNOSIS — E559 Vitamin D deficiency, unspecified: Secondary | ICD-10-CM

## 2019-12-10 DIAGNOSIS — Z79899 Other long term (current) drug therapy: Secondary | ICD-10-CM

## 2019-12-10 DIAGNOSIS — G4701 Insomnia due to medical condition: Secondary | ICD-10-CM | POA: Diagnosis not present

## 2019-12-10 DIAGNOSIS — G8929 Other chronic pain: Secondary | ICD-10-CM

## 2019-12-10 DIAGNOSIS — M810 Age-related osteoporosis without current pathological fracture: Secondary | ICD-10-CM

## 2019-12-10 DIAGNOSIS — Z1231 Encounter for screening mammogram for malignant neoplasm of breast: Secondary | ICD-10-CM | POA: Diagnosis not present

## 2019-12-10 DIAGNOSIS — Z8719 Personal history of other diseases of the digestive system: Secondary | ICD-10-CM | POA: Diagnosis not present

## 2019-12-10 DIAGNOSIS — Z85038 Personal history of other malignant neoplasm of large intestine: Secondary | ICD-10-CM

## 2019-12-10 LAB — COMPREHENSIVE METABOLIC PANEL
ALT: 13 U/L (ref 0–35)
AST: 20 U/L (ref 0–37)
Albumin: 3.9 g/dL (ref 3.5–5.2)
Alkaline Phosphatase: 47 U/L (ref 39–117)
BUN: 19 mg/dL (ref 6–23)
CO2: 30 mEq/L (ref 19–32)
Calcium: 9.3 mg/dL (ref 8.4–10.5)
Chloride: 103 mEq/L (ref 96–112)
Creatinine, Ser: 0.97 mg/dL (ref 0.40–1.20)
GFR: 56.75 mL/min — ABNORMAL LOW (ref >60.00)
Glucose, Bld: 93 mg/dL (ref 70–99)
Potassium: 4.9 mEq/L (ref 3.5–5.1)
Sodium: 139 mEq/L (ref 135–145)
Total Bilirubin: 0.4 mg/dL (ref 0.2–1.2)
Total Protein: 6.6 g/dL (ref 6.0–8.3)

## 2019-12-10 LAB — CBC WITH DIFFERENTIAL/PLATELET
Basophils Absolute: 0 K/uL (ref 0.0–0.1)
Basophils Relative: 0.8 % (ref 0.0–3.0)
Eosinophils Absolute: 0.1 K/uL (ref 0.0–0.7)
Eosinophils Relative: 2.8 % (ref 0.0–5.0)
HCT: 42 % (ref 36.0–46.0)
Hemoglobin: 13.8 g/dL (ref 12.0–15.0)
Lymphocytes Relative: 39.6 % (ref 12.0–46.0)
Lymphs Abs: 2 K/uL (ref 0.7–4.0)
MCHC: 32.9 g/dL (ref 30.0–36.0)
MCV: 97.2 fl (ref 78.0–100.0)
Monocytes Absolute: 0.5 K/uL (ref 0.1–1.0)
Monocytes Relative: 10.6 % (ref 3.0–12.0)
Neutro Abs: 2.4 K/uL (ref 1.4–7.7)
Neutrophils Relative %: 46.2 % (ref 43.0–77.0)
Platelets: 202 K/uL (ref 150.0–400.0)
RBC: 4.32 Mil/uL (ref 3.87–5.11)
RDW: 13.8 % (ref 11.5–15.5)
WBC: 5.1 K/uL (ref 4.0–10.5)

## 2019-12-10 LAB — VITAMIN D 25 HYDROXY (VIT D DEFICIENCY, FRACTURES): VITD: 60.62 ng/mL (ref 30.00–100.00)

## 2019-12-10 LAB — TSH: TSH: 3.39 u[IU]/mL (ref 0.35–4.50)

## 2019-12-10 MED ORDER — TRAMADOL HCL 50 MG PO TABS: 50.0000 mg | ORAL_TABLET | Freq: Four times a day (QID) | ORAL | 5 refills | Status: DC

## 2019-12-10 MED ORDER — LORAZEPAM 1 MG PO TABS: 1.0000 mg | ORAL_TABLET | Freq: Two times a day (BID) | ORAL | 5 refills | Status: DC | PRN

## 2019-12-10 NOTE — Patient Instructions (Addendum)
Please return in 6 months to recheck pain and sleep. Medicare recommends an Annual Wellness Visit for all patients. Please schedule this to be done with our Nurse Educator, Loma Sousa. This is an informative "talk" visit; it's goals are to ensure that your health care needs are being met and to give you education regarding avoiding falls, ensuring you are not suffering from depression or problems with memory or thinking, and to educate you on Advance Care Planning. It helps me take good care of you!  I will release your lab results to you on your MyChart account with further instructions. Please reply with any questions.   Today you were given your Prevnar vaccination. That completes your pneumococcal vaccinations.   If you have any questions or concerns, please don't hesitate to send me a message via MyChart or call the office at [phone]. Thank you for visiting with Haley Huerta today! It's our pleasure caring for you.  Raloxifene tablets What is this medicine? RALOXIFENE (ral OX i feen) reduces the amount of calcium lost from bones. It is used to treat and prevent osteoporosis in women who have experienced menopause. It may also help prevent invasive breast cancer in certain women who have a high risk for breast cancer. This medicine may be used for other purposes; ask your health care provider or pharmacist if you have questions. COMMON BRAND NAME(S): Evista What should I tell my health care provider before I take this medicine? They need to know if you have any of these conditions:  a history of blood clots  cancer  heart disease or recent heart attack  high levels of triglycerides (blood fat) in the blood  history of stroke  kidney disease  liver disease  premenopausal  smoke tobacco  an unusual or allergic reaction to raloxifene, other medicines, foods, dyes, or preservatives  pregnant or trying to get pregnant  breast-feeding How should I use this medicine? Take this medicine  by mouth with a glass of water. Follow the directions on the prescription label. The tablets can be taken with or without food. Take your doses at regular intervals. Do not take your medicine more often than directed. A special MedGuide will be given to you by the pharmacist with each prescription and refill. Be sure to read this information carefully each time. Talk to your pediatrician regarding the use of this medicine in children. Special care may be needed. Overdosage: If you think you have taken too much of this medicine contact a poison control center or emergency room at once. NOTE: This medicine is only for you. Do not share this medicine with others. What if I miss a dose? If you miss a dose, take it as soon as you can. If it is almost time for your next dose, take only that dose. Do not take double or extra doses. What may interact with this medicine?  cholestyramine  female hormones, like estrogens  warfarin This list may not describe all possible interactions. Give your health care provider a list of all the medicines, herbs, non-prescription drugs, or dietary supplements you use. Also tell them if you smoke, drink alcohol, or use illegal drugs. Some items may interact with your medicine. What should I watch for while using this medicine? Visit your doctor or health care professional for regular checks on your progress. Do not stop taking this medicine except on the advice of your doctor or health care professional. If you are taking this medicine to reduce your risk of getting breast cancer,  you should know that this medicine does not prevent all types of breast cancer. Talk to your doctor if you have questions. This medicine does not prevent hot flashes. It may cause hot flashes in some patients at the start of therapy. You should make sure that you get enough calcium and vitamin D while you are taking this medicine. Discuss the foods you eat and the vitamins you take with your  health care professional. Exercise may help to prevent bone loss. Discuss your exercise needs with your doctor or health care professional. This medicine can rarely cause blood clots. If you are going to have surgery, tell your doctor or health care professional that you are taking this medicine. This medicine should be stopped at least 3 days before surgery. After surgery, it should be restarted only after you are walking again. It should not be restarted while you still need long periods of bed rest. You should not smoke while taking this medicine. Smoking may increase your risk of blood clots or stroke. If you have any reason to think you are pregnant; stop taking this medicine at once and contact your doctor or health care professional. Do not breast feed while taking this medicine. What side effects may I notice from receiving this medicine? Side effects that you should report to your doctor or health care professional as soon as possible:  allergic reactions like skin rash, itching or hives, swelling of the face, lips, or tongue)  breast tissue changes or discharge  signs and symptoms of a blood clot such as breathing problems; changes in vision; chest pain; severe, sudden headache; pain, swelling, warmth in the leg; trouble speaking; sudden numbness or weakness of the face, arm or leg  signs and symptoms of a stroke like changes in vision; confusion; trouble speaking or understanding; severe headaches; sudden numbness or weakness of the face, arm or leg; trouble walking; dizziness; loss of balance or coordination  vaginal discharge that is bloody, brown, or rust Side effects that usually do not require medical attention (report to your doctor or health care professional if they continue or are bothersome):  hot flashes  joint pain  leg cramps  sweating  swelling of the ankles, feet, hands This list may not describe all possible side effects. Call your doctor for medical advice  about side effects. You may report side effects to FDA at 1-800-FDA-1088. Where should I keep my medicine? Keep out of the reach of children. Store at room temperature between 15 and 30 degrees C (59 and 86 degrees F). Throw away any unused medicine after the expiration date. NOTE: This sheet is a summary. It may not cover all possible information. If you have questions about this medicine, talk to your doctor, pharmacist, or health care provider.  2020 Elsevier/Gold Standard (2016-12-20 17:15:34)

## 2019-12-10 NOTE — Progress Notes (Signed)
Subjective  Chief Complaint  Patient presents with  . Annual Exam  . Osteoporosis    HPI: Haley Huerta is a 69 y.o. female who presents to [facility] [place] today for a Female Wellness Visit. She also has the concerns and/or needs as listed above in the chief complaint. These will be addressed in addition to the Health Maintenance Visit.   Wellness Visit: annual visit with health maintenance review and exam without Pap   HM: due mammogram and dexa: pt would like to defer until after covid surge due to HR comorbidities. Healthy diet. Active. crc screen up to date. Due AWV. Due prevnar Chronic disease f/u and/or acute problem visit: (deemed necessary to be done in addition to the wellness visit):  Insomnia w/ chronic benzo use: due refill. I reviewed patient's records from the PMP aware controlled substance registry today.   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 No new sxs. Requests refill. I reviewed patient's records from the PMP aware controlled substance registry today.   H/o colon cancer: no GI sxs. Healthy stable weight. Screens up to date.   Osteoporosis: she took prolia x 2 but had allergic reaction with facial and skin itching and acne like rash on face. Now better but not completely resolved. No f/c/s or myalgias. Would consider alternative. No h/o fracture. + GI concerns making biphosphonates CI.   Assessment  1. Insomnia secondary to chronic pain   2. Spondylosis of cervical region without myelopathy or radiculopathy   3. History of colon cancer- cecal adenocarcinoma   4. 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 Chronic use of benzodiazepine for therapeutic purpose   6. S/P left hemicolectomy   7. History of ulcerative colitis   8. Encounter for screening mammogram for breast cancer   9. Vitamin D deficiency   10. Need for pneumococcal vaccination      Plan  Female Wellness  Visit:  Age appropriate Health Maintenance and Prevention measures were discussed with patient. Included topics are cancer screening recommendations, ways to keep healthy (see AVS) including dietary and exercise recommendations, regular eye and dental care, use of seat belts, and avoidance of moderate alcohol use and tobacco use. Schedule mammo and dexa for summer 2021. After covid.   BMI: discussed patient's BMI and encouraged positive lifestyle modifications to help get to or maintain a target BMI.  HM needs and immunizations were addressed and ordered. See below for orders. See HM and immunization section for updates. prevnar today.   Routine labs and screening tests ordered including cmp, cbc and lipids where appropriate.  Discussed recommendations regarding Vit D and calcium supplementation (see AVS)  Chronic disease management visit and/or acute problem visit:  Osteoporosis: rec evista; pt will research and consider it. Will get dexa and then start, especially if worsening. Discussed risks/benefits. Feel she would be a good candidate. Continue exercise, ca and vit D  Chronic pain is well controlled. Refilled ultram.   Chronic benzo for sleep; refilled ultram. Well controlled.   Follow up: Return in about 6 months (around 06/08/2020) for recheck pain and sleep. AWV as well Orders Placed This Encounter  Procedures  . mammo BC  . Dexa BC  . Pneumococcal conjugate vaccine 13-valent  . CBC w/Diff  . CMP  . TSH  . Vit D 25OH   Meds ordered this encounter  Medications  . LORazepam (ATIVAN) 1 MG tablet    Sig: Take 1 tablet (1  mg total) by mouth 2 (two) times daily as needed for anxiety. Please hold for next refill due.    Dispense:  100 tablet    Refill:  5  . traMADol (ULTRAM) 50 MG tablet    Sig: Take 1 tablet (50 mg total) by mouth 4 (four) times daily.    Dispense:  120 tablet    Refill:  5      Lifestyle: Body mass index is 22.01 kg/m. Wt Readings from Last 3  Encounters:  12/10/19 128 lb 3.2 oz (58.2 kg)  04/16/19 125 lb (56.7 kg)  02/10/19 115 lb (52.2 kg)     Patient Active Problem List   Diagnosis Date Noted  . 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 History of colon cancer- cecal adenocarcinoma 08/23/2018    Priority: High  . S/P left hemicolectomy 08/23/2018    Priority: High    Chemo and radiation and hemicolectomy 2002   . DJD (degenerative joint disease) of cervical spine 08/23/2018    Priority: High    Chronic pain on tramadol   . Osteoporosis 08/23/2018    Priority: High    Dexa 08/2017 lowest T=-2.8; vit d and calcium; rec tx, to start prolia 09/2018   . Chronic use of benzodiazepine for therapeutic purpose 08/23/2018    Priority: High    Uses for neck pain, muscle tension (failed tricyclics, mm relaxers) and neck pain. Has been ativan since 2003 since neck surgery.   Marland Kitchen History of ulcerative colitis 08/23/2018    Priority: Medium  . Macular degeneration 08/23/2018    Priority: Low   Health Maintenance  Topic Date Due  . MAMMOGRAM  08/29/2019  . DEXA SCAN  08/29/2019  . COLONOSCOPY  12/07/2020  . TETANUS/TDAP  08/07/2024  . INFLUENZA VACCINE  Completed  . Hepatitis C Screening  Completed  . PNA vac Low Risk Adult  Completed   Immunization History  Administered Date(s) Administered  . Fluad Quad(high Dose 65+) 07/29/2019  . Influenza, High Dose Seasonal PF 08/23/2018  . Pneumococcal Conjugate-13 12/10/2019  . Pneumococcal Polysaccharide-23 08/23/2018  . Tdap 08/07/2014  . Zoster 08/06/2013  . Zoster Recombinat (Shingrix) 09/26/2018, 01/13/2019   We updated and reviewed the patient's past history in detail and it is documented below. Allergies: Patient is allergic to prolia [denosumab] and phenergan [promethazine hcl]. Past Medical History Patient  has a past medical history of Allergy, Arthritis, Chronic use of benzodiazepine for therapeutic purpose (08/23/2018), Colon cancer  ([place]) (2001), DJD (degenerative joint disease) of cervical spine (08/23/2018), GERD (gastroesophageal reflux disease), History of ulcerative colitis (08/23/2018), and Osteoporosis (08/23/2018). Past Surgical History Patient  has a past surgical history that includes Abdominal hysterectomy; Hemicolectomy (2001); Cervical discectomy; Breast surgery; and Appendectomy. Family History: Patient family history includes Arthritis in her mother; Congestive Heart Failure in her father; Depression in her son; Hypertension in her father; Prostate cancer in her father; Restless legs syndrome in her brother and father; Stroke in her mother. Social History:  Patient  reports that she has never smoked. She has never used smokeless tobacco. She reports current alcohol use. She reports that she does not use drugs.  Review of Systems: Constitutional: negative for fever or malaise Ophthalmic: negative for photophobia, double vision or loss of vision Cardiovascular: negative for chest pain, dyspnea on exertion, or new LE swelling Respiratory: negative for SOB or persistent cough Gastrointestinal: negative for abdominal pain, change in bowel habits or melena Genitourinary: negative for dysuria or  gross hematuria, no abnormal uterine bleeding or disharge Neurological: negative for TIA or stroke symptoms Psychiatric: negative for SI or delusions Allergic/Immunologic: negative for hives  Patient Care Team    Relationship Specialty Notifications Start End  Leamon Arnt, MD PCP - General Family Medicine  08/23/18   Sherlynn Stalls, MD Consulting Physician Ophthalmology  08/23/18     Objective  Vitals: BP (!) 144/80 (BP Location: Right Arm, Patient Position: Sitting, Cuff Size: Normal)   Pulse 69   Temp 97.6 F (36.4 C) (Temporal)   Ht 5\' 4"  (1.626 m)   Wt 128 lb 3.2 oz (58.2 kg)   SpO2 99%   BMI 22.01 kg/m  General:  Well developed, well nourished, no acute distress  Psych:  Alert and orientedx3,normal mood  and affect HEENT:  Normocephalic, atraumatic, non-icteric sclera, PERRL, oropharynx is clear without mass or exudate, supple neck without adenopathy, mass or thyromegaly Cardiovascular:  Normal S1, S2, RRR without gallop, rub or murmur, nondisplaced PMI Respiratory:  Good breath sounds bilaterally, CTAB with normal respiratory effort Gastrointestinal: normal bowel sounds, soft, non-tender, no noted masses. No HSM MSK: no deformities, contusions. Joints are without erythema or swelling. Spine and CVA region are nontender Skin:  Warm,mild redness on cheeks. Light acne on forehead Neurologic:    Mental status is normal. CN 2-11 are normal. Gross motor and sensory exams are normal. Normal gait. No tremor Breast Exam: bilateral implants present limiting exam. No mass, skin retraction or nipple discharge is appreciated in either breast. No axillary adenopathy. Fibrocystic changes are not noted   Commons side effects, risks, benefits, and alternatives for medications and treatment plan prescribed today were discussed, and the patient expressed understanding of the given instructions. Patient is instructed to call or message via MyChart if he/she has any questions or concerns regarding our treatment plan. No barriers to understanding were identified. We discussed Red Flag symptoms and signs in detail. Patient expressed understanding regarding what to do in case of urgent or emergency type symptoms.   Medication list was reconciled, printed and provided to the patient in AVS. Patient instructions and summary information was reviewed with the patient as documented in the AVS. This note was prepared with assistance of Dragon voice recognition software. Occasional wrong-word or sound-a-like substitutions may have occurred due to the inherent limitations of voice recognition software  This visit occurred during the SARS-CoV-2 public health emergency.  Safety protocols were in place, including screening questions  prior to the visit, additional usage of staff PPE, and extensive cleaning of exam room while observing appropriate contact time as indicated for disinfecting solutions.

## 2020-01-01 ENCOUNTER — Encounter: Payer: Self-pay | Admitting: Family Medicine

## 2020-01-02 ENCOUNTER — Telehealth: Payer: Self-pay | Admitting: Family Medicine

## 2020-01-02 ENCOUNTER — Telehealth: Payer: Self-pay

## 2020-01-02 NOTE — Telephone Encounter (Signed)
PA submitted via covermymeds for tramadol Key: [id number] - PA Case ID: [id number]

## 2020-01-02 NOTE — Telephone Encounter (Signed)
Patient notified to send [place] updated [employer] information so PA can be submitted.

## 2020-01-02 NOTE — Telephone Encounter (Signed)
Pt states she needs [employer] to contact [facility].

## 2020-01-02 NOTE — Telephone Encounter (Signed)
Pt called following up on authorization for Tramadol. Pt states [employer] needs to be called and given authorization [id number]. Please advise.

## 2020-01-03 NOTE — Telephone Encounter (Signed)
Please update patients insurance and then send back to [place].

## 2020-01-06 ENCOUNTER — Encounter: Payer: Self-pay | Admitting: Family Medicine

## 2020-01-06 ENCOUNTER — Telehealth: Payer: Self-pay

## 2020-01-06 NOTE — Telephone Encounter (Signed)
Please notify patient that her current insurance plan will not cover ultram for chronic use.   She can schedule a visit to discuss alternatives unless she already knows what would be helpful for her. She may need to consider enrolling in different plan or use Drug RX to check out how much ultram would cost without insurance.

## 2020-01-06 NOTE — Telephone Encounter (Signed)
Patient calling to follow up on authorization for [employer] drug plan.

## 2020-01-06 NOTE — Telephone Encounter (Signed)
PA was denied for Tramadol.

## 2020-01-06 NOTE — Telephone Encounter (Signed)
error 

## 2020-01-07 DIAGNOSIS — H43811 Vitreous degeneration, right eye: Secondary | ICD-10-CM | POA: Diagnosis not present

## 2020-01-07 DIAGNOSIS — H2513 Age-related nuclear cataract, bilateral: Secondary | ICD-10-CM | POA: Diagnosis not present

## 2020-01-07 DIAGNOSIS — H353111 Nonexudative age-related macular degeneration, right eye, early dry stage: Secondary | ICD-10-CM | POA: Diagnosis not present

## 2020-01-07 DIAGNOSIS — H353221 Exudative age-related macular degeneration, left eye, with active choroidal neovascularization: Secondary | ICD-10-CM | POA: Diagnosis not present

## 2020-01-07 NOTE — Telephone Encounter (Signed)
Patient notified via my chart message.

## 2020-01-18 ENCOUNTER — Ambulatory Visit: Payer: Medicare Other | Attending: Internal Medicine

## 2020-01-18 DIAGNOSIS — Z23 Encounter for immunization: Secondary | ICD-10-CM | POA: Insufficient documentation

## 2020-01-18 NOTE — Progress Notes (Signed)
   Covid-19 Vaccination Clinic  Name:  Haley Huerta    MRN: [id number] DOB: 03-09-1951  01/18/2020  Haley Huerta was observed post Covid-19 immunization for 15 minutes without incidence. She was provided with Vaccine Information Sheet and instruction to access the V-Safe system.   Haley Huerta was instructed to call 911 with any severe reactions post vaccine: Marland Kitchen Difficulty breathing  . Swelling of your face and throat  . A fast heartbeat  . A bad rash all over your body  . Dizziness and weakness    Immunizations Administered    Name Date Dose VIS Date Route   Pfizer COVID-19 Vaccine 01/18/2020 12:56 PM 0.3 mL 11/07/2019 Intramuscular   Manufacturer: [place]   Lot: [id number]   [place]: [id number]

## 2020-02-11 ENCOUNTER — Ambulatory Visit: Payer: Medicare Other | Attending: Internal Medicine

## 2020-02-11 DIAGNOSIS — Z23 Encounter for immunization: Secondary | ICD-10-CM

## 2020-02-11 NOTE — Progress Notes (Signed)
   Covid-19 Vaccination Clinic  Name:  Haley Huerta    MRN: [id number] DOB: August 06, 1951  02/11/2020  [name] was observed post Covid-19 immunization for 15 minutes without incident. She was provided with Vaccine Information Sheet and instruction to access the V-Safe system.   [name] was instructed to call 911 with any severe reactions post vaccine: Marland Kitchen Difficulty breathing  . Swelling of face and throat  . A fast heartbeat  . A bad rash all over body  . Dizziness and weakness   Immunizations Administered    Name Date Dose VIS Date Route   Pfizer COVID-19 Vaccine 02/11/2020  1:32 PM 0.3 mL 11/07/2019 Intramuscular   Manufacturer: [place]   Lot: [id number]   [place]: [id number]

## 2020-02-18 DIAGNOSIS — H353221 Exudative age-related macular degeneration, left eye, with active choroidal neovascularization: Secondary | ICD-10-CM | POA: Diagnosis not present

## 2020-03-12 ENCOUNTER — Other Ambulatory Visit: Payer: Self-pay

## 2020-03-12 ENCOUNTER — Ambulatory Visit (INDEPENDENT_AMBULATORY_CARE_PROVIDER_SITE_OTHER): Payer: Medicare Other

## 2020-03-12 DIAGNOSIS — M810 Age-related osteoporosis without current pathological fracture: Secondary | ICD-10-CM

## 2020-03-12 DIAGNOSIS — Z0000 Encounter for general adult medical examination without abnormal findings: Secondary | ICD-10-CM | POA: Diagnosis not present

## 2020-03-12 NOTE — Progress Notes (Signed)
This visit is being conducted via phone call due to the COVID-19 pandemic. This patient has given me verbal consent via phone to conduct this visit, patient states they are participating from their home address. Some vital signs may be absent or patient reported.   Patient identification: identified by name, DOB, and current address.  Location provider: [facility], Office Persons participating in the virtual visit: Denman George LPN, patient, and Dr. Billey Chang   Subjective:   Haley Huerta is a 69 y.o. female who presents for Medicare Annual (Subsequent) preventive examination.  Review of Systems:   Cardiac Risk Factors include: advanced age (>35men, >92 women)    Objective:     Vitals: There were no vitals taken for this visit.  There is no height or weight on file to calculate BMI.  Advanced Directives 03/12/2020 10/16/2018  Does Patient Have a Medical Advance Directive? No No  Would patient like information on creating a medical advance directive? Yes (MAU/Ambulatory/Procedural Areas - Information given) Yes (MAU/Ambulatory/Procedural Areas - Information given)    Tobacco Social History   Tobacco Use  Smoking Status Never Smoker  Smokeless Tobacco Never Used     Counseling given: Not Answered   Clinical Intake:  Pre-visit preparation completed: Yes  Pain : No/denies pain  Diabetes: No  How often do you need to have someone help you when you read instructions, pamphlets, or other written materials from your doctor or pharmacy?: 1 - Never  Interpreter Needed?: No  Information entered by :: Denman George LPN  Past Medical History:  Diagnosis Date  . Allergy   . Arthritis   . 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  . Colon cancer ([place]) 2001  . 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 GERD  (gastroesophageal reflux disease)   . History of ulcerative colitis 08/23/2018  . Osteoporosis 08/23/2018   Dexa 08/2017 lowest T=-2.8; vit d and calcium; rec tx, pt to think about it.    Past Surgical History:  Procedure Laterality Date  . ABDOMINAL HYSTERECTOMY    . APPENDECTOMY    . BREAST SURGERY     Implants  . CERVICAL DISCECTOMY     Fusion   . HEMICOLECTOMY  2001   R- Cancer    Family History  Problem Relation Age of Onset  . Arthritis Mother   . Stroke Mother   . Prostate cancer Father   . Congestive Heart Failure Father   . Hypertension Father   . Restless legs syndrome Father   . Depression Son   . Restless legs syndrome Brother    Social History   Socioeconomic History  . Marital status: Divorced    Spouse name: Not on file  . Number of children: 1  . Years of education: Not on file  . Highest education level: Not on file  Occupational History  . Occupation: retired, Quarry manager etc  Tobacco Use  . Smoking status: Never Smoker  . Smokeless tobacco: Never Used  Substance and Sexual Activity  . Alcohol use: Yes    Comment: rarely   . Drug use: Never  . Sexual activity: Not Currently    Birth control/protection: Post-menopausal  Other Topics Concern  . Not on file  Social History Narrative   Previously lived in [place] moved to area to be closer to son  Social Determinants of Health   Financial Resource Strain:   . Difficulty of Paying Living Expenses:   Food Insecurity:   . Worried About Charity fundraiser in the Last Year:   . Arboriculturist in the Last Year:   Transportation Needs:   . Film/video editor (Medical):   Marland Kitchen Lack of Transportation (Non-Medical):   Physical Activity:   . Days of Exercise per Week:   . Minutes of Exercise per Session:   Stress:   . Feeling of Stress :   Social Connections:   . Frequency of Communication with Friends and Family:   . Frequency of Social Gatherings with Friends and Family:   .  Attends Religious Services:   . Active Member of Clubs or Organizations:   . Attends Archivist Meetings:   Marland Kitchen Marital Status:     Outpatient Encounter Medications as of 03/12/2020  Medication Sig  . diphenhydrAMINE (BENADRYL) 25 MG tablet Take 25 mg by mouth every 6 (six) hours as needed.  . fluticasone (FLONASE) 50 MCG/ACT nasal spray Place into both nostrils daily.  Marland Kitchen ibuprofen (ADVIL,MOTRIN) 200 MG tablet Take 200 mg by mouth every 6 (six) hours as needed.  Marland Kitchen LORazepam (ATIVAN) 1 MG tablet Take 1 tablet (1 mg total) by mouth 2 (two) times daily as needed for anxiety. Please hold for next refill due.  . Multiple Vitamins-Minerals (PRESERVISION AREDS PO) Take by mouth.  . NON FORMULARY 1 % every morning.  . NON FORMULARY daily.  . Olopatadine HCl 0.2 % SOLN Apply 1 drop to eye daily.  . traMADol (ULTRAM) 50 MG tablet Take 1 tablet (50 mg total) by mouth 4 (four) times daily.   No facility-administered encounter medications on file as of 03/12/2020.    Activities of Daily Living In your present state of health, do you have any difficulty performing the following activities: 03/12/2020  Hearing? N  Vision? N  Difficulty concentrating or making decisions? N  Walking or climbing stairs? N  Dressing or bathing? N  Doing errands, shopping? N  Preparing Food and eating ? N  Using the Toilet? N  In the past six months, have you accidently leaked urine? N  Do you have problems with loss of bowel control? N  Managing your Medications? N  Managing your Finances? N  Housekeeping or managing your Housekeeping? N  Some recent data might be hidden    Patient Care Team: Leamon Arnt, MD as PCP - General (Family Medicine) Sherlynn Stalls, MD as Consulting Physician (Ophthalmology)    Assessment:   This is a routine wellness examination for Haley Huerta.  Exercise Activities and Dietary recommendations Current Exercise Habits: Home exercise routine, Type of exercise: walking,  Time (Minutes): 30, Frequency (Times/Week): 4, Weekly Exercise (Minutes/Week): 120, Intensity: Mild  Goals    . Patient Stated     Increase social activity.        Fall Risk Fall Risk  03/12/2020 12/10/2019 04/16/2019 02/10/2019 10/16/2018  Falls in the past year? 0 0 0 0 0  Number falls in past yr: 0 - 0 0 -  Injury with Fall? 0 - 0 0 -  Follow up Falls evaluation completed;Education provided;Falls prevention discussed Falls evaluation completed Falls evaluation completed Falls evaluation completed -   Is the patient's home free of loose throw rugs in walkways, pet beds, electrical cords, etc?   yes      Grab bars in the bathroom? yes  Handrails on the stairs?   yes      Adequate lighting?   yes  Depression Screen PHQ 2/9 Scores 03/12/2020 04/16/2019 10/16/2018 08/23/2018  PHQ - 2 Score 0 0 0 0     Cognitive Function- no cognitive concerns at this time  MMSE - Mini Mental State Exam 10/16/2018  Orientation to time 4  Orientation to Place 5  Registration 3  Attention/ Calculation 5  Recall 2  Language- name 2 objects 2  Language- repeat 1  Language- follow 3 step command 3  Language- read & follow direction 1  Write a sentence 1  Copy design 1  Total score 28     6CIT Screen 03/12/2020  What Year? 0 points  What month? 0 points  What time? 0 points  Count back from 20 0 points  Months in reverse 0 points  Repeat phrase 0 points  Total Score 0    Immunization History  Administered Date(s) Administered  . Fluad Quad(high Dose 65+) 07/29/2019  . Influenza, High Dose Seasonal PF 08/23/2018  . PFIZER SARS-COV-2 Vaccination 01/18/2020, 02/11/2020  . Pneumococcal Conjugate-13 12/10/2019  . Pneumococcal Polysaccharide-23 08/23/2018  . Tdap 08/07/2014  . Zoster 08/06/2013  . Zoster Recombinat (Shingrix) 09/26/2018, 01/13/2019    Qualifies for Shingles Vaccine? Shingrix completed   Screening Tests Health Maintenance  Topic Date Due  . MAMMOGRAM  08/29/2019  .  DEXA SCAN  08/29/2019  . INFLUENZA VACCINE  06/27/2020  . COLONOSCOPY  12/07/2020  . TETANUS/TDAP  08/07/2024  . Hepatitis C Screening  Completed  . PNA vac Low Risk Adult  Completed    Cancer Screenings: Lung: Low Dose CT Chest recommended if Age 52-80 years, 30 pack-year currently smoking OR have quit w/in 15years. Patient does not qualify. Breast:  Up to date on Mammogram? No; patient states that she will self schedule  Up to date of Bone Density/Dexa? No; ordered today  Colorectal: colonoscopy 12/07/17    Plan:  I have personally reviewed and addressed the Medicare Annual Wellness questionnaire and have noted the following in the patient's chart:  A. Medical and social history B. Use of alcohol, tobacco or illicit drugs  C. Current medications and supplements D. Functional ability and status E.  Nutritional status F.  Physical activity G. Advance directives H. List of other physicians I.  Hospitalizations, surgeries, and ER visits in previous 12 months J.  [place] such as hearing and vision if needed, cognitive and depression L. Referrals, records requested, and appointments- dexa ordered   In addition, I have reviewed and discussed with patient certain preventive protocols, quality metrics, and best practice recommendations. A written personalized care plan for preventive services as well as general preventive health recommendations were provided to patient.   Signed,  Denman George, LPN  Nurse Health Advisor   Nurse Notes: Patient provided with patient education materials on osteoporosis and treatment options.

## 2020-03-12 NOTE — Addendum Note (Signed)
Addended by: Denman George B on: 03/12/2020 01:40 PM   Modules accepted: Orders

## 2020-03-12 NOTE — Patient Instructions (Signed)
Ms. Haley Huerta , Thank you for taking time to come for your Medicare Wellness Visit. I appreciate your ongoing commitment to your health goals. Please review the following plan we discussed and let me know if I can assist you in the future.   Screening recommendations/referrals: Colorectal Screening: up to date; last colonoscopy 12/07/17 Mammogram: recommended; see handout to schedule  Bone Density: recommended; orders placed today   Vision and Dental Exams: Recommended annual ophthalmology exams for early detection of glaucoma and other disorders of the eye Recommended annual dental exams for proper oral hygiene  Vaccinations: Influenza vaccine: completed 07/2019 Pneumococcal vaccine: up to date; last 12/10/19 Tdap vaccine: up to date; last 08/07/14 Shingles vaccine: Shingrix completed  Covid vaccine:  Completed   Advanced directives: Please bring a copy of your POA (Power of [place]) and/or Living Will to your next appointment.  Goals: Recommend to drink at least 6-8 8oz glasses of water per day and consume a balanced diet rich in fresh fruits and vegetables.   Next appointment: Please schedule your Annual Wellness Visit with your Nurse Health Advisor in one year.  Preventive Care 52 Years and Older, Female Preventive care refers to lifestyle choices and visits with your health care provider that can promote health and wellness. What does preventive care include?  A yearly physical exam. This is also called an annual well check.  Dental exams once or twice a year.  Routine eye exams. Ask your health care provider how often you should have your eyes checked.  Personal lifestyle choices, including:  Daily care of your teeth and gums.  Regular physical activity.  Eating a healthy diet.  Avoiding tobacco and drug use.  Limiting alcohol use.  Practicing safe sex.  Taking low-dose aspirin every day if recommended by your health care provider.  Taking vitamin and mineral  supplements as recommended by your health care provider. What happens during an annual well check? The services and screenings done by your health care provider during your annual well check will depend on your age, overall health, lifestyle risk factors, and family history of disease. Counseling  Your health care provider may ask you questions about your:  Alcohol use.  Tobacco use.  Drug use.  Emotional well-being.  Home and relationship well-being.  Sexual activity.  Eating habits.  History of falls.  Memory and ability to understand (cognition).  Work and work Statistician.  Reproductive health. Screening  You may have the following tests or measurements:  Height, weight, and BMI.  Blood pressure.  Lipid and cholesterol levels. These may be checked every 5 years, or more frequently if you are over 33 years old.  Skin check.  Lung cancer screening. You may have this screening every year starting at age 76 if you have a 30-pack-year history of smoking and currently smoke or have quit within the past 15 years.  Fecal occult blood test (FOBT) of the stool. You may have this test every year starting at age 42.  Flexible sigmoidoscopy or colonoscopy. You may have a sigmoidoscopy every 5 years or a colonoscopy every 10 years starting at age 2.  Hepatitis C blood test.  Hepatitis B blood test.  Sexually transmitted disease (STD) testing.  Diabetes screening. This is done by checking your blood sugar (glucose) after you have not eaten for a while (fasting). You may have this done every 1-3 years.  Bone density scan. This is done to screen for osteoporosis. You may have this done starting at age 51.  Mammogram. This may be done every 1-2 years. Talk to your health care provider about how often you should have regular mammograms. Talk with your health care provider about your test results, treatment options, and if necessary, the need for more tests. Vaccines  Your  health care provider may recommend certain vaccines, such as:  Influenza vaccine. This is recommended every year.  Tetanus, diphtheria, and acellular pertussis (Tdap, Td) vaccine. You may need a Td booster every 10 years.  Zoster vaccine. You may need this after age 51.  Pneumococcal 13-valent conjugate (PCV13) vaccine. One dose is recommended after age 88.  Pneumococcal polysaccharide (PPSV23) vaccine. One dose is recommended after age 26. Talk to your health care provider about which screenings and vaccines you need and how often you need them. This information is not intended to replace advice given to you by your health care provider. Make sure you discuss any questions you have with your health care provider. Document Released: 12/10/2015 Document Revised: 08/02/2016 Document Reviewed: 09/14/2015 Elsevier Interactive Patient Education  2017 [place] Prevention in the Home Falls can cause injuries. They can happen to people of all ages. There are many things you can do to make your home safe and to help prevent falls. What can I do on the outside of my home?  Regularly fix the edges of walkways and driveways and fix any cracks.  Remove anything that might make you trip as you walk through a door, such as a raised step or threshold.  Trim any bushes or trees on the path to your home.  Use bright outdoor lighting.  Clear any walking paths of anything that might make someone trip, such as rocks or tools.  Regularly check to see if handrails are loose or broken. Make sure that both sides of any steps have handrails.  Any raised decks and porches should have guardrails on the edges.  Have any leaves, snow, or ice cleared regularly.  Use sand or salt on walking paths during winter.  Clean up any spills in your garage right away. This includes oil or grease spills. What can I do in the bathroom?  Use night lights.  Install grab bars by the toilet and in the tub and  shower. Do not use towel bars as grab bars.  Use non-skid mats or decals in the tub or shower.  If you need to sit down in the shower, use a plastic, non-slip stool.  Keep the floor dry. Clean up any water that spills on the floor as soon as it happens.  Remove soap buildup in the tub or shower regularly.  Attach bath mats securely with double-sided non-slip rug tape.  Do not have throw rugs and other things on the floor that can make you trip. What can I do in the bedroom?  Use night lights.  Make sure that you have a light by your bed that is easy to reach.  Do not use any sheets or blankets that are too big for your bed. They should not hang down onto the floor.  Have a firm chair that has side arms. You can use this for support while you get dressed.  Do not have throw rugs and other things on the floor that can make you trip. What can I do in the kitchen?  Clean up any spills right away.  Avoid walking on wet floors.  Keep items that you use a lot in easy-to-reach places.  If you need to reach  something above you, use a strong step stool that has a grab bar.  Keep electrical cords out of the way.  Do not use floor polish or wax that makes floors slippery. If you must use wax, use non-skid floor wax.  Do not have throw rugs and other things on the floor that can make you trip. What can I do with my stairs?  Do not leave any items on the stairs.  Make sure that there are handrails on both sides of the stairs and use them. Fix handrails that are broken or loose. Make sure that handrails are as long as the stairways.  Check any carpeting to make sure that it is firmly attached to the stairs. Fix any carpet that is loose or worn.  Avoid having throw rugs at the top or bottom of the stairs. If you do have throw rugs, attach them to the floor with carpet tape.  Make sure that you have a light switch at the top of the stairs and the bottom of the stairs. If you do not  have them, ask someone to add them for you. What else can I do to help prevent falls?  Wear shoes that:  Do not have high heels.  Have rubber bottoms.  Are comfortable and fit you well.  Are closed at the toe. Do not wear sandals.  If you use a stepladder:  Make sure that it is fully opened. Do not climb a closed stepladder.  Make sure that both sides of the stepladder are locked into place.  Ask someone to hold it for you, if possible.  Clearly mark and make sure that you can see:  Any grab bars or handrails.  First and last steps.  Where the edge of each step is.  Use tools that help you move around (mobility aids) if they are needed. These include:  Canes.  Walkers.  Scooters.  Crutches.  Turn on the lights when you go into a dark area. Replace any light bulbs as soon as they burn out.  Set up your furniture so you have a clear path. Avoid moving your furniture around.  If any of your floors are uneven, fix them.  If there are any pets around you, be aware of where they are.  Review your medicines with your doctor. Some medicines can make you feel dizzy. This can increase your chance of falling. Ask your doctor what other things that you can do to help prevent falls. This information is not intended to replace advice given to you by your health care provider. Make sure you discuss any questions you have with your health care provider. Document Released: 09/09/2009 Document Revised: 04/20/2016 Document Reviewed: 12/18/2014 Elsevier Interactive Patient Education  2017 [employer].

## 2020-03-31 DIAGNOSIS — H353221 Exudative age-related macular degeneration, left eye, with active choroidal neovascularization: Secondary | ICD-10-CM | POA: Diagnosis not present

## 2020-03-31 DIAGNOSIS — H2513 Age-related nuclear cataract, bilateral: Secondary | ICD-10-CM | POA: Diagnosis not present

## 2020-03-31 DIAGNOSIS — H43811 Vitreous degeneration, right eye: Secondary | ICD-10-CM | POA: Diagnosis not present

## 2020-03-31 DIAGNOSIS — H353111 Nonexudative age-related macular degeneration, right eye, early dry stage: Secondary | ICD-10-CM | POA: Diagnosis not present

## 2020-06-02 ENCOUNTER — Encounter: Payer: Self-pay | Admitting: Family Medicine

## 2020-06-02 NOTE — Telephone Encounter (Signed)
Please set up refills and get her scheduled for CPE. We are booked a ways out.

## 2020-06-03 ENCOUNTER — Other Ambulatory Visit: Payer: Self-pay

## 2020-06-03 MED ORDER — LORAZEPAM 1 MG PO TABS: 1.0000 mg | ORAL_TABLET | Freq: Two times a day (BID) | ORAL | 5 refills | Status: DC | PRN

## 2020-06-03 MED ORDER — TRAMADOL HCL 50 MG PO TABS: 50.0000 mg | ORAL_TABLET | Freq: Four times a day (QID) | ORAL | 5 refills | Status: DC

## 2020-06-03 NOTE — Telephone Encounter (Signed)
Medications have been sent to the patient's pharmacy. Patient notified via [employer].

## 2020-06-09 ENCOUNTER — Ambulatory Visit: Payer: Medicare Other | Admitting: Family Medicine

## 2020-06-09 DIAGNOSIS — H2513 Age-related nuclear cataract, bilateral: Secondary | ICD-10-CM | POA: Diagnosis not present

## 2020-06-09 DIAGNOSIS — H353221 Exudative age-related macular degeneration, left eye, with active choroidal neovascularization: Secondary | ICD-10-CM | POA: Diagnosis not present

## 2020-06-09 DIAGNOSIS — H353111 Nonexudative age-related macular degeneration, right eye, early dry stage: Secondary | ICD-10-CM | POA: Diagnosis not present

## 2020-06-09 DIAGNOSIS — H43813 Vitreous degeneration, bilateral: Secondary | ICD-10-CM | POA: Diagnosis not present

## 2020-06-11 ENCOUNTER — Other Ambulatory Visit: Payer: Self-pay

## 2020-06-11 ENCOUNTER — Encounter: Payer: Self-pay | Admitting: Family Medicine

## 2020-06-11 ENCOUNTER — Ambulatory Visit (INDEPENDENT_AMBULATORY_CARE_PROVIDER_SITE_OTHER): Payer: Medicare Other | Admitting: Family Medicine

## 2020-06-11 VITALS — BP 150/84 | HR 71 | Temp 98.0°F | Resp 16 | Ht 64.0 in | Wt 123.4 lb

## 2020-06-11 DIAGNOSIS — Z79899 Other long term (current) drug therapy: Secondary | ICD-10-CM | POA: Diagnosis not present

## 2020-06-11 DIAGNOSIS — G4701 Insomnia due to medical condition: Secondary | ICD-10-CM

## 2020-06-11 DIAGNOSIS — M810 Age-related osteoporosis without current pathological fracture: Secondary | ICD-10-CM | POA: Diagnosis not present

## 2020-06-11 DIAGNOSIS — M47812 Spondylosis without myelopathy or radiculopathy, cervical region: Secondary | ICD-10-CM

## 2020-06-11 DIAGNOSIS — G8929 Other chronic pain: Secondary | ICD-10-CM

## 2020-06-11 DIAGNOSIS — Z1231 Encounter for screening mammogram for malignant neoplasm of breast: Secondary | ICD-10-CM

## 2020-06-11 MED ORDER — LORAZEPAM 1 MG PO TABS: 1.0000 mg | ORAL_TABLET | Freq: Two times a day (BID) | ORAL | 5 refills | Status: DC | PRN

## 2020-06-11 MED ORDER — TRAMADOL HCL 50 MG PO TABS: 50.0000 mg | ORAL_TABLET | Freq: Four times a day (QID) | ORAL | 5 refills | Status: DC

## 2020-06-11 NOTE — Patient Instructions (Signed)
Please return in January 2022 for your annual complete physical; please come fasting.  I have refilled your medication for the next 6 months.   If you have any questions or concerns, please don't hesitate to send me a message via MyChart or call the office at [phone]. Thank you for visiting with Haley Huerta today! It's our pleasure caring for you.  I have ordered a mammogram and/or bone density for you as we discussed today: [x]   Mammogram  [x]   Bone Density  Please call the office checked below to schedule your appointment: Your appointment will at the following location  [x]   [facility]      [place], [place]         []   [place] [address], [place]  We will consider starting evista for your osteoporosis.

## 2020-06-11 NOTE — Progress Notes (Signed)
Subjective  CC:  Chief Complaint  Patient presents with  . Medication Refill    tramadol and lorazepam     HPI: Haley Huerta is a 69 y.o. female who presents to the office today to address the problems listed above in the chief complaint.  Chronic back pain managed long term with tramadol. Here for 6 month recheck. Doing well. No new concerns. No AEs from meds. I reviewed patient's records from the PMP aware controlled substance registry today.   Insomnia managed long term with benzo. Due refill.   Osteoporosis: we had discussed treatments in the past; had 2 doses of prolia but had allergic reaction. She is hesitant to start new meds. We have discussed evista  HM: due mamm:  Now willing to have done since has had covid vaccines.   Assessment  1. Spondylosis of cervical region without myelopathy or radiculopathy   2. Chronic use of benzodiazepine for therapeutic purpose   3. Age-related osteoporosis without current pathological fracture   4. Insomnia secondary to chronic pain   5. Encounter for screening mammogram for breast cancer      Plan   Chronic pain:  Refilled tramadol. Use is stable and effective  Insomnia: chronic benzo use. Uses nightly and occ will use 2nd dose. 45/mo prescribed  mammo ordered  Osteoporosis recheck dexa and rec evista. Pt to consider. Can consider endocrine evaluation as well for further education / options.   Follow up: 6 months for cpe and f/u  Visit date not found  Orders Placed This Encounter  Procedures  . MM DIGITAL SCREENING BILATERAL  . DG Bone Density   Meds ordered this encounter  Medications  . traMADol (ULTRAM) 50 MG tablet    Sig: Take 1 tablet (50 mg total) by mouth 4 (four) times daily.    Dispense:  120 tablet    Refill:  5  . LORazepam (ATIVAN) 1 MG tablet    Sig: Take 1 tablet (1 mg total) by mouth 2 (two) times daily as needed for anxiety.    Dispense:  45 tablet    Refill:  5    Please cancel recent  refill for disp #100. Thank you.      I reviewed the patients updated PMH, FH, and SocHx.    Patient Active Problem List   Diagnosis Date Noted  . 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 History of colon cancer- cecal adenocarcinoma 08/23/2018    Priority: High  . S/P left hemicolectomy 08/23/2018    Priority: High  . DJD (degenerative joint disease) of cervical spine 08/23/2018    Priority: High  . Osteoporosis 08/23/2018    Priority: High  . Chronic use of benzodiazepine for therapeutic purpose 08/23/2018    Priority: High  . History of ulcerative colitis 08/23/2018    Priority: Medium  . Macular degeneration 08/23/2018    Priority: Low   Current Meds  Medication Sig  . diphenhydrAMINE (BENADRYL) 25 MG tablet Take 25 mg by mouth every 6 (six) hours as needed.  . fluticasone (FLONASE) 50 MCG/ACT nasal spray Place into both nostrils daily.  Marland Kitchen LORazepam (ATIVAN) 1 MG tablet Take 1 tablet (1 mg total) by mouth 2 (two) times daily as needed for anxiety.  . Multiple Vitamins-Minerals (PRESERVISION AREDS PO) Take by mouth.  . NON FORMULARY 1 % every morning.  . NON FORMULARY daily.  . Olopatadine HCl 0.2 % SOLN Apply 1 drop to eye daily.  Marland Kitchen  traMADol (ULTRAM) 50 MG tablet Take 1 tablet (50 mg total) by mouth 4 (four) times daily.  . [DISCONTINUED] LORazepam (ATIVAN) 1 MG tablet Take 1 tablet (1 mg total) by mouth 2 (two) times daily as needed for anxiety. Please hold for next refill due.  . [DISCONTINUED] traMADol (ULTRAM) 50 MG tablet Take 1 tablet (50 mg total) by mouth 4 (four) times daily.    Allergies: Patient is allergic to prolia [denosumab] and phenergan [promethazine hcl]. Family History: Patient family history includes Arthritis in her mother; Congestive Heart Failure in her father; Depression in her son; Hypertension in her father; Prostate cancer in her father; Restless legs syndrome in her brother and father; Stroke in her mother. Social  History:  Patient  reports that she has never smoked. She has never used smokeless tobacco. She reports current alcohol use. She reports that she does not use drugs.  Review of Systems: Constitutional: Negative for fever malaise or anorexia Cardiovascular: negative for chest pain Respiratory: negative for SOB or persistent cough Gastrointestinal: negative for abdominal pain  Objective  Vitals: BP (!) 150/84   Pulse 71   Temp 98 F (36.7 C) (Temporal)   Resp 16   Ht 5\' 4"  (1.626 m)   Wt 123 lb 6.4 oz (56 kg)   SpO2 98%   BMI 21.18 kg/m  General: no acute distress , A&Ox3     Commons side effects, risks, benefits, and alternatives for medications and treatment plan prescribed today were discussed, and the patient expressed understanding of the given instructions. Patient is instructed to call or message via MyChart if he/she has any questions or concerns regarding our treatment plan. No barriers to understanding were identified. We discussed Red Flag symptoms and signs in detail. Patient expressed understanding regarding what to do in case of urgent or emergency type symptoms.   Medication list was reconciled, printed and provided to the patient in AVS. Patient instructions and summary information was reviewed with the patient as documented in the AVS. This note was prepared with assistance of Dragon voice recognition software. Occasional wrong-word or sound-a-like substitutions may have occurred due to the inherent limitations of voice recognition software  This visit occurred during the SARS-CoV-2 public health emergency.  Safety protocols were in place, including screening questions prior to the visit, additional usage of staff PPE, and extensive cleaning of exam room while observing appropriate contact time as indicated for disinfecting solutions.

## 2020-06-14 ENCOUNTER — Encounter: Payer: Self-pay | Admitting: Family Medicine

## 2020-06-25 ENCOUNTER — Other Ambulatory Visit: Payer: Self-pay | Admitting: Family Medicine

## 2020-06-25 DIAGNOSIS — M810 Age-related osteoporosis without current pathological fracture: Secondary | ICD-10-CM

## 2020-08-18 DIAGNOSIS — H353221 Exudative age-related macular degeneration, left eye, with active choroidal neovascularization: Secondary | ICD-10-CM | POA: Diagnosis not present

## 2020-08-18 DIAGNOSIS — H353111 Nonexudative age-related macular degeneration, right eye, early dry stage: Secondary | ICD-10-CM | POA: Diagnosis not present

## 2020-08-18 DIAGNOSIS — H43811 Vitreous degeneration, right eye: Secondary | ICD-10-CM | POA: Diagnosis not present

## 2020-08-30 DIAGNOSIS — Z23 Encounter for immunization: Secondary | ICD-10-CM | POA: Diagnosis not present

## 2020-09-27 ENCOUNTER — Other Ambulatory Visit: Payer: Medicare Other

## 2020-09-27 ENCOUNTER — Ambulatory Visit: Payer: Medicare Other

## 2020-09-28 ENCOUNTER — Other Ambulatory Visit: Payer: Self-pay | Admitting: Family Medicine

## 2020-09-28 DIAGNOSIS — Z1231 Encounter for screening mammogram for malignant neoplasm of breast: Secondary | ICD-10-CM

## 2020-10-27 DIAGNOSIS — H353111 Nonexudative age-related macular degeneration, right eye, early dry stage: Secondary | ICD-10-CM | POA: Diagnosis not present

## 2020-10-27 DIAGNOSIS — H2513 Age-related nuclear cataract, bilateral: Secondary | ICD-10-CM | POA: Diagnosis not present

## 2020-10-27 DIAGNOSIS — H43811 Vitreous degeneration, right eye: Secondary | ICD-10-CM | POA: Diagnosis not present

## 2020-10-27 DIAGNOSIS — H353221 Exudative age-related macular degeneration, left eye, with active choroidal neovascularization: Secondary | ICD-10-CM | POA: Diagnosis not present

## 2020-11-27 HISTORY — PX: CATARACT EXTRACTION: SUR2

## 2020-12-15 ENCOUNTER — Encounter: Payer: Self-pay | Admitting: Family Medicine

## 2020-12-15 ENCOUNTER — Other Ambulatory Visit: Payer: Self-pay

## 2020-12-15 MED ORDER — LORAZEPAM 1 MG PO TABS: 1.0000 mg | ORAL_TABLET | Freq: Two times a day (BID) | ORAL | 5 refills | Status: DC | PRN

## 2020-12-15 MED ORDER — TRAMADOL HCL 50 MG PO TABS: 50.0000 mg | ORAL_TABLET | Freq: Four times a day (QID) | ORAL | 5 refills | Status: DC

## 2020-12-15 NOTE — Telephone Encounter (Signed)
Can reschedule cpe visit. If she has concerns regarding her med refills for med problems can keep OV as video visit but not if she only has a phone.  I can refill her meds until her cpe. Just send refill request back to me if she is good with that and things are stable.

## 2020-12-15 NOTE — Telephone Encounter (Signed)
Last refill:  06/11/20 #45,5                   06/11/20 #120, 5 Last OV: 06/11/20 dx. Medication management

## 2020-12-17 ENCOUNTER — Ambulatory Visit: Payer: Medicare Other | Admitting: Family Medicine

## 2021-01-10 ENCOUNTER — Other Ambulatory Visit: Payer: Medicare Other

## 2021-01-10 ENCOUNTER — Inpatient Hospital Stay: Admission: RE | Admit: 2021-01-10 | Payer: Medicare Other | Source: Ambulatory Visit

## 2021-01-12 DIAGNOSIS — H2513 Age-related nuclear cataract, bilateral: Secondary | ICD-10-CM | POA: Diagnosis not present

## 2021-01-12 DIAGNOSIS — H353221 Exudative age-related macular degeneration, left eye, with active choroidal neovascularization: Secondary | ICD-10-CM | POA: Diagnosis not present

## 2021-01-12 DIAGNOSIS — H353111 Nonexudative age-related macular degeneration, right eye, early dry stage: Secondary | ICD-10-CM | POA: Diagnosis not present

## 2021-01-12 DIAGNOSIS — H43811 Vitreous degeneration, right eye: Secondary | ICD-10-CM | POA: Diagnosis not present

## 2021-02-22 DIAGNOSIS — H353114 Nonexudative age-related macular degeneration, right eye, advanced atrophic with subfoveal involvement: Secondary | ICD-10-CM | POA: Diagnosis not present

## 2021-02-22 DIAGNOSIS — H2512 Age-related nuclear cataract, left eye: Secondary | ICD-10-CM | POA: Diagnosis not present

## 2021-02-22 DIAGNOSIS — H2513 Age-related nuclear cataract, bilateral: Secondary | ICD-10-CM | POA: Diagnosis not present

## 2021-02-22 DIAGNOSIS — H25013 Cortical age-related cataract, bilateral: Secondary | ICD-10-CM | POA: Diagnosis not present

## 2021-02-22 DIAGNOSIS — H18413 Arcus senilis, bilateral: Secondary | ICD-10-CM | POA: Diagnosis not present

## 2021-02-22 DIAGNOSIS — H25043 Posterior subcapsular polar age-related cataract, bilateral: Secondary | ICD-10-CM | POA: Diagnosis not present

## 2021-02-22 DIAGNOSIS — H353221 Exudative age-related macular degeneration, left eye, with active choroidal neovascularization: Secondary | ICD-10-CM | POA: Diagnosis not present

## 2021-03-03 ENCOUNTER — Encounter: Payer: Self-pay | Admitting: Family Medicine

## 2021-03-03 ENCOUNTER — Other Ambulatory Visit: Payer: Self-pay

## 2021-03-03 ENCOUNTER — Ambulatory Visit (INDEPENDENT_AMBULATORY_CARE_PROVIDER_SITE_OTHER): Payer: Medicare Other | Admitting: Family Medicine

## 2021-03-03 VITALS — BP 128/86 | HR 84 | Temp 97.7°F | Resp 16 | Ht 64.0 in | Wt 124.4 lb

## 2021-03-03 DIAGNOSIS — Z8719 Personal history of other diseases of the digestive system: Secondary | ICD-10-CM

## 2021-03-03 DIAGNOSIS — R21 Rash and other nonspecific skin eruption: Secondary | ICD-10-CM | POA: Diagnosis not present

## 2021-03-03 DIAGNOSIS — Z9049 Acquired absence of other specified parts of digestive tract: Secondary | ICD-10-CM | POA: Diagnosis not present

## 2021-03-03 DIAGNOSIS — G8929 Other chronic pain: Secondary | ICD-10-CM | POA: Diagnosis not present

## 2021-03-03 DIAGNOSIS — M47812 Spondylosis without myelopathy or radiculopathy, cervical region: Secondary | ICD-10-CM

## 2021-03-03 DIAGNOSIS — G4701 Insomnia due to medical condition: Secondary | ICD-10-CM

## 2021-03-03 DIAGNOSIS — E559 Vitamin D deficiency, unspecified: Secondary | ICD-10-CM

## 2021-03-03 DIAGNOSIS — M810 Age-related osteoporosis without current pathological fracture: Secondary | ICD-10-CM | POA: Diagnosis not present

## 2021-03-03 DIAGNOSIS — Z79899 Other long term (current) drug therapy: Secondary | ICD-10-CM

## 2021-03-03 DIAGNOSIS — Z85038 Personal history of other malignant neoplasm of large intestine: Secondary | ICD-10-CM | POA: Diagnosis not present

## 2021-03-03 LAB — COMPREHENSIVE METABOLIC PANEL
ALT: 16 U/L (ref 0–35)
AST: 21 U/L (ref 0–37)
Albumin: 4.2 g/dL (ref 3.5–5.2)
Alkaline Phosphatase: 61 U/L (ref 39–117)
BUN: 23 mg/dL (ref 6–23)
CO2: 28 mEq/L (ref 19–32)
Calcium: 9.5 mg/dL (ref 8.4–10.5)
Chloride: 102 mEq/L (ref 96–112)
Creatinine, Ser: 1.11 mg/dL (ref 0.40–1.20)
GFR: 50.06 mL/min — ABNORMAL LOW (ref >60.00)
Glucose, Bld: 74 mg/dL (ref 70–99)
Potassium: 3.9 mEq/L (ref 3.5–5.1)
Sodium: 139 mEq/L (ref 135–145)
Total Bilirubin: 0.7 mg/dL (ref 0.2–1.2)
Total Protein: 6.8 g/dL (ref 6.0–8.3)

## 2021-03-03 LAB — CBC WITH DIFFERENTIAL/PLATELET
Basophils Absolute: 0 10*3/uL (ref 0.0–0.1)
Basophils Relative: 0.5 % (ref 0.0–3.0)
Eosinophils Absolute: 0.1 10*3/uL (ref 0.0–0.7)
Eosinophils Relative: 1.8 % (ref 0.0–5.0)
HCT: 40.8 % (ref 36.0–46.0)
Hemoglobin: 13.6 g/dL (ref 12.0–15.0)
Lymphocytes Relative: 30.8 % (ref 12.0–46.0)
Lymphs Abs: 1.5 10*3/uL (ref 0.7–4.0)
MCHC: 33.4 g/dL (ref 30.0–36.0)
MCV: 95.7 fl (ref 78.0–100.0)
Monocytes Absolute: 0.5 10*3/uL (ref 0.1–1.0)
Monocytes Relative: 10.6 % (ref 3.0–12.0)
Neutro Abs: 2.8 10*3/uL (ref 1.4–7.7)
Neutrophils Relative %: 56.3 % (ref 43.0–77.0)
Platelets: 255 10*3/uL (ref 150.0–400.0)
RBC: 4.27 Mil/uL (ref 3.87–5.11)
RDW: 13.7 % (ref 11.5–15.5)
WBC: 5 10*3/uL (ref 4.0–10.5)

## 2021-03-03 LAB — VITAMIN D 25 HYDROXY (VIT D DEFICIENCY, FRACTURES): VITD: 40.41 ng/mL (ref 30.00–100.00)

## 2021-03-03 LAB — TSH: TSH: 4.85 u[IU]/mL — ABNORMAL HIGH (ref 0.35–4.50)

## 2021-03-03 NOTE — Progress Notes (Signed)
Subjective  Chief Complaint  Patient presents with  . Chronic use of benzodiazepine for therapeutic purpose   . Age-related osteoporosis without current pathological fract    Had reaction to Prolia - small red bumps with heat on face and scalp. Wanting to get set up with dermatologist - wanting preference from Dr. Jonni Sanger   . Insomnia secondary to chronic pain    Taking 1 Ativan at night, usually gets about 7-8 hours. Does wake up every 4 hours   . Cataract    Dr. Baird Cancer referred to Dr. Katherene Ponto - surgery set for 5/23    HPI: Haley Huerta is a 70 y.o. female who presents to [facility] today for a Female Wellness Visit. She also has the concerns and/or needs as listed above in the chief complaint. These will be addressed in addition to the Health Maintenance Visit.   Wellness Visit: annual visit with health maintenance review and exam without Pap   HM: due CRC screen, mammo and dexa. imms up to date. Feeling well.   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 I reviewed patient's records from the PMP aware controlled substance registry today.   Insomnia managed with benzos. Stable.   Complains of facial rash since the allergic reaction she had last year after Prolia injection.  She describes facial bumps, itching, flaking.  She is tried multiple over-the-counter creams and therapies without success.  She would like to see dermatologist.  No spreading to other areas of her body.  No scalp lesions.  Osteoporosis: To get bone density in July.  Cannot tolerate biphosphonate's due to GI issues.  Had allergic reaction to Prolia.  Recommended this time if indicated.  Continue calcium and vitamin D.  History of ulcerative colitis: In remission.  History of colon cancer status post hemicolectomy.  Denies bowel  symptoms.  Needs referral to GI for colonoscopy for colorectal cancer surveillance.  Assessment  1. Spondylosis of cervical region without myelopathy or radiculopathy   2. Insomnia secondary to chronic pain   3. Chronic use of benzodiazepine for therapeutic purpose   4. 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 History of colon cancer- cecal adenocarcinoma   6. S/P left hemicolectomy   7. History of ulcerative colitis   8. Vitamin D deficiency      Plan  Female Wellness Visit:  Age appropriate Health Maintenance and Prevention measures were discussed with patient. Included topics are cancer screening recommendations, ways to keep healthy (see AVS) including dietary and exercise recommendations, regular eye and dental care, use of seat belts, and avoidance of moderate alcohol use and tobacco use.  Mammogram and referral to GI for colorectal cancer surveillance.  BMI: discussed patient's BMI and encouraged positive lifestyle modifications to help get to or maintain a target BMI.  HM needs and immunizations were addressed and ordered. See below for orders. See HM and immunization section for updates.  Up-to-date  Routine labs and screening tests ordered including cmp, cbc and lipids where appropriate.  Discussed recommendations regarding Vit D and calcium supplementation (see AVS)  Chronic disease management visit and/or acute problem visit:  Chronic pain: Continue tramadol 4 times daily.  Well-controlled.  Chronic benzo use for insomnia: Stable.  Well-controlled.  Understands risk versus benefits.  Osteoporosis: Follow-up on bone density.  Discussed  treatment options if indicated.  Patient is hesitant.  No new GI concerns.  Refer to GI  Facial rash: Suspect rosacea and/or seborrheic dermatitis.  Will refer to GI per patient preference.  She states inciting event was an allergic reaction.  Follow up: 6 months for pain management and sleep follow-up Orders  Placed This Encounter  Procedures  . CBC with Differential/Platelet  . Comprehensive metabolic panel  . TSH  . VITAMIN D 25 Hydroxy (Vit-D Deficiency, Fractures)  . Ambulatory referral to Gastroenterology   No orders of the defined types were placed in this encounter.     Body mass index is 21.35 kg/m. Wt Readings from Last 3 Encounters:  03/03/21 124 lb 6.4 oz (56.4 kg)  06/11/20 123 lb 6.4 oz (56 kg)  12/10/19 128 lb 3.2 oz (58.2 kg)     Patient Active Problem List   Diagnosis Date Noted  . 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 History of colon cancer- cecal adenocarcinoma 08/23/2018    Priority: High  . S/P left hemicolectomy 08/23/2018    Priority: High    Chemo and radiation and hemicolectomy 2002   . DJD (degenerative joint disease) of cervical spine 08/23/2018    Priority: High    Chronic pain on tramadol   . Osteoporosis 08/23/2018    Priority: High    Dexa 08/2017 lowest T=-2.8; vit d and calcium; rec tx, to start prolia 09/2018- had allergic reaction to prolia. Dexa 2021 ordered: rec evista.   . Chronic use of benzodiazepine for therapeutic purpose 08/23/2018    Priority: High    Uses for neck pain, muscle tension (failed tricyclics, mm relaxers) and neck pain. Has been ativan since 2003 since neck surgery.   Marland Kitchen History of ulcerative colitis 08/23/2018    Priority: Medium  . Macular degeneration 08/23/2018    Priority: Low   Health Maintenance  Topic Date Due  . MAMMOGRAM  08/28/2018  . DEXA SCAN  08/29/2019  . COLONOSCOPY (Pts 45-59yrs Insurance coverage will need to be confirmed)  12/07/2020  . INFLUENZA VACCINE  06/27/2021  . TETANUS/TDAP  08/07/2024  . COVID-19 Vaccine  Completed  . Hepatitis C Screening  Completed  . PNA vac Low Risk Adult  Completed  . HPV VACCINES  Aged Out   Immunization History  Administered Date(s) Administered  . Fluad Quad(high Dose 65+) 07/29/2019  . Influenza, High Dose Seasonal PF  08/23/2018  . PFIZER(Purple Top)SARS-COV-2 Vaccination 01/18/2020, 02/11/2020, 08/30/2020  . Pneumococcal Conjugate-13 12/10/2019  . Pneumococcal Polysaccharide-23 08/23/2018  . Tdap 08/07/2014  . Zoster 08/06/2013  . Zoster Recombinat (Shingrix) 09/26/2018, 01/13/2019   We updated and reviewed the patient's past history in detail and it is documented below. Allergies: Patient is allergic to prolia [denosumab] and phenergan [promethazine hcl]. Past Medical History Patient  has a past medical history of Allergy, Arthritis, Chronic use of benzodiazepine for therapeutic purpose (08/23/2018), Colon cancer ([place]) (2001), DJD (degenerative joint disease) of cervical spine (08/23/2018), GERD (gastroesophageal reflux disease), History of ulcerative colitis (08/23/2018), and Osteoporosis (08/23/2018). Past Surgical History Patient  has a past surgical history that includes Abdominal hysterectomy; Hemicolectomy (2001); Cervical discectomy; Breast surgery; and Appendectomy. Family History: Patient family history includes Arthritis in her mother; Congestive Heart Failure in her father; Depression in her son; Hypertension in her father; Prostate cancer in her father; Restless legs syndrome in her brother and father; Stroke in her mother. Social History:  Patient  reports that she has never  smoked. She has never used smokeless tobacco. She reports current alcohol use. She reports that she does not use drugs.  Review of Systems: Constitutional: negative for fever or malaise Ophthalmic: negative for photophobia, double vision or loss of vision Cardiovascular: negative for chest pain, dyspnea on exertion, or new LE swelling Respiratory: negative for SOB or persistent cough Gastrointestinal: negative for abdominal pain, change in bowel habits or melena Genitourinary: negative for dysuria or gross hematuria, no abnormal uterine bleeding or disharge Musculoskeletal: negative for new gait disturbance or muscular  weakness Integumentary: negative for new or persistent rashes, no breast lumps Neurological: negative for TIA or stroke symptoms Psychiatric: negative for SI or delusions Allergic/Immunologic: negative for hives  Patient Care Team    Relationship Specialty Notifications Start End  Leamon Arnt, MD PCP - General Family Medicine  08/23/18   Sherlynn Stalls, MD Consulting Physician Ophthalmology  08/23/18     Objective  Vitals: BP 128/86   Pulse 84   Temp 97.7 F (36.5 C) (Temporal)   Resp 16   Ht 5\' 4"  (1.626 m)   Wt 124 lb 6.4 oz (56.4 kg)   SpO2 97%   BMI 21.35 kg/m  General:  Well developed, well nourished, no acute distress, thin Psych:  Alert and orientedx3,normal mood and affect HEENT:  Normocephalic, atraumatic, non-icteric sclera,  supple neck without adenopathy, mass or thyromegaly Cardiovascular:  Normal S1, S2, RRR without gallop, rub or murmur Respiratory:  Good breath sounds bilaterally, CTAB with normal respiratory effort Gastrointestinal: normal bowel sounds, soft, non-tender, no noted masses. No HSM MSK: no deformities, contusions. Joints are without erythema or swelling.  Skin:  Warm, no rashes or suspicious lesions noted Neurologic:    Mental status is normal. CN 2-11 are normal. Gross motor and sensory exams are normal. Normal gait. No tremor Breast Exam: Bilateral breast implants present.  Firm breast implant on the right.  No masses palpated.  No axillary adenopathy.   Commons side effects, risks, benefits, and alternatives for medications and treatment plan prescribed today were discussed, and the patient expressed understanding of the given instructions. Patient is instructed to call or message via MyChart if he/she has any questions or concerns regarding our treatment plan. No barriers to understanding were identified. We discussed Red Flag symptoms and signs in detail. Patient expressed understanding regarding what to do in case of urgent or emergency type  symptoms.   Medication list was reconciled, printed and provided to the patient in AVS. Patient instructions and summary information was reviewed with the patient as documented in the AVS. This note was prepared with assistance of Dragon voice recognition software. Occasional wrong-word or sound-a-like substitutions may have occurred due to the inherent limitations of voice recognition software  This visit occurred during the SARS-CoV-2 public health emergency.  Safety protocols were in place, including screening questions prior to the visit, additional usage of staff PPE, and extensive cleaning of exam room while observing appropriate contact time as indicated for disinfecting solutions.

## 2021-03-03 NOTE — Patient Instructions (Signed)
Please return in 6 months for recheck on pain mgt and sleep.   I will release your lab results to you on your MyChart account with further instructions. Please reply with any questions.   We will call you with information regarding your referral appointment. Dermatology and GI.  If you do not hear from Haley Huerta within the next 2 weeks, please let me know. It can take 1-2 weeks to get appointments set up with the specialists.   If you have any questions or concerns, please don't hesitate to send me a message via MyChart or call the office at [phone]. Thank you for visiting with Haley Huerta today! It's our pleasure caring for you.   Preventive Care 72 Years and Older, Female Preventive care refers to lifestyle choices and visits with your health care provider that can promote health and wellness. This includes:  A yearly physical exam. This is also called an annual wellness visit.  Regular dental and eye exams.  Immunizations.  Screening for certain conditions.  Healthy lifestyle choices, such as: ? Eating a healthy diet. ? Getting regular exercise. ? Not using drugs or products that contain nicotine and tobacco. ? Limiting alcohol use. What can I expect for my preventive care visit? Physical exam Your health care provider will check your:  Height and weight. These may be used to calculate your BMI (body mass index). BMI is a measurement that tells if you are at a healthy weight.  Heart rate and blood pressure.  Body temperature.  Skin for abnormal spots. Counseling Your health care provider may ask you questions about your:  Past medical problems.  Family's medical history.  Alcohol, tobacco, and drug use.  Emotional well-being.  Home life and relationship well-being.  Sexual activity.  Diet, exercise, and sleep habits.  History of falls.  Memory and ability to understand (cognition).  Work and work Statistician.  Pregnancy and menstrual history.  Access to  firearms. What immunizations do I need? Vaccines are usually given at various ages, according to a schedule. Your health care provider will recommend vaccines for you based on your age, medical history, and lifestyle or other factors, such as travel or where you work.   What tests do I need? Blood tests  Lipid and cholesterol levels. These may be checked every 5 years, or more often depending on your overall health.  Hepatitis C test.  Hepatitis B test. Screening  Lung cancer screening. You may have this screening every year starting at age 78 if you have a 30-pack-year history of smoking and currently smoke or have quit within the past 15 years.  Colorectal cancer screening. ? All adults should have this screening starting at age 23 and continuing until age 85. ? Your health care provider may recommend screening at age 36 if you are at increased risk. ? You will have tests every 1-10 years, depending on your results and the type of screening test.  Diabetes screening. ? This is done by checking your blood sugar (glucose) after you have not eaten for a while (fasting). ? You may have this done every 1-3 years.  Mammogram. ? This may be done every 1-2 years. ? Talk with your health care provider about how often you should have regular mammograms.  Abdominal aortic aneurysm (AAA) screening. You may need this if you are a current or former smoker.  BRCA-related cancer screening. This may be done if you have a family history of breast, ovarian, tubal, or peritoneal cancers. Other tests  STD (sexually transmitted disease) testing, if you are at risk.  Bone density scan. This is done to screen for osteoporosis. You may have this done starting at age 72. Talk with your health care provider about your test results, treatment options, and if necessary, the need for more tests. Follow these instructions at home: Eating and drinking  Eat a diet that includes fresh fruits and vegetables,  whole grains, lean protein, and low-fat dairy products. Limit your intake of foods with high amounts of sugar, saturated fats, and salt.  Take vitamin and mineral supplements as recommended by your health care provider.  Do not drink alcohol if your health care provider tells you not to drink.  If you drink alcohol: ? Limit how much you have to 0-1 drink a day. ? Be aware of how much alcohol is in your drink. In the [place] one drink equals one 12 oz bottle of beer (355 mL), one 5 oz glass of wine (148 mL), or one 1 oz glass of hard liquor (44 mL).   Lifestyle  Take daily care of your teeth and gums. Brush your teeth every morning and night with fluoride toothpaste. Floss one time each day.  Stay active. Exercise for at least 30 minutes 5 or more days each week.  Do not use any products that contain nicotine or tobacco, such as cigarettes, e-cigarettes, and chewing tobacco. If you need help quitting, ask your health care provider.  Do not use drugs.  If you are sexually active, practice safe sex. Use a condom or other form of protection in order to prevent STIs (sexually transmitted infections).  Talk with your health care provider about taking a low-dose aspirin or statin.  Find healthy ways to cope with stress, such as: ? Meditation, yoga, or listening to music. ? Journaling. ? Talking to a trusted person. ? Spending time with friends and family. Safety  Always wear your seat belt while driving or riding in a vehicle.  Do not drive: ? If you have been drinking alcohol. Do not ride with someone who has been drinking. ? When you are tired or distracted. ? While texting.  Wear a helmet and other protective equipment during sports activities.  If you have firearms in your house, make sure you follow all gun safety procedures. What's next?  Visit your health care provider once a year for an annual wellness visit.  Ask your health care provider how often you should have your  eyes and teeth checked.  Stay up to date on all vaccines. This information is not intended to replace advice given to you by your health care provider. Make sure you discuss any questions you have with your health care provider. Document Revised: 11/03/2020 Document Reviewed: 11/07/2018 Elsevier Patient Education  2021 [employer].

## 2021-03-21 DIAGNOSIS — Z23 Encounter for immunization: Secondary | ICD-10-CM | POA: Diagnosis not present

## 2021-03-30 DIAGNOSIS — H43811 Vitreous degeneration, right eye: Secondary | ICD-10-CM | POA: Diagnosis not present

## 2021-03-30 DIAGNOSIS — H2513 Age-related nuclear cataract, bilateral: Secondary | ICD-10-CM | POA: Diagnosis not present

## 2021-03-30 DIAGNOSIS — H353111 Nonexudative age-related macular degeneration, right eye, early dry stage: Secondary | ICD-10-CM | POA: Diagnosis not present

## 2021-03-30 DIAGNOSIS — H353221 Exudative age-related macular degeneration, left eye, with active choroidal neovascularization: Secondary | ICD-10-CM | POA: Diagnosis not present

## 2021-04-15 ENCOUNTER — Ambulatory Visit (INDEPENDENT_AMBULATORY_CARE_PROVIDER_SITE_OTHER): Payer: Medicare Other

## 2021-04-15 DIAGNOSIS — Z0000 Encounter for general adult medical examination without abnormal findings: Secondary | ICD-10-CM | POA: Diagnosis not present

## 2021-04-15 NOTE — Progress Notes (Signed)
Virtual Visit via Telephone Note  I connected with  Desma Maxim on 04/15/21 at 11:00 AM EDT by telephone and verified that I am speaking with the correct person using two identifiers.  Medicare Annual Wellness visit completed telephonically due to Covid-19 pandemic.   Persons participating in this call: This Health Coach and this patient.   Location: Patient: Home Provider: Office    I discussed the limitations, risks, security and privacy concerns of performing an evaluation and management service by telephone and the availability of in person appointments. The patient expressed understanding and agreed to proceed.  Unable to perform video visit due to video visit attempted and failed and/or patient does not have video capability.   Some vital signs may be absent or patient reported.   Willette Brace, LPN    Subjective:   ERLEAN MEALOR is a 70 y.o. female who presents for Medicare Annual (Subsequent) preventive examination.  Review of Systems     Cardiac Risk Factors include: advanced age (>60men, >66 women)     Objective:    There were no vitals filed for this visit. There is no height or weight on file to calculate BMI.  Advanced Directives 04/15/2021 03/12/2020 10/16/2018  Does Patient Have a Medical Advance Directive? Yes No No  Type of Advance Directive [place] in Chart? No - copy requested - -  Would patient like information on creating a medical advance directive? - Yes (MAU/Ambulatory/Procedural Areas - Information given) Yes (MAU/Ambulatory/Procedural Areas - Information given)    Current Medications (verified) Outpatient Encounter Medications as of 04/15/2021  Medication Sig  . diphenhydrAMINE (BENADRYL) 25 MG tablet Take 25 mg by mouth every 6 (six) hours as needed.  . fluticasone (FLONASE) 50 MCG/ACT nasal spray Place into both nostrils daily.  Marland Kitchen LORazepam (ATIVAN) 1 MG tablet Take  1 tablet (1 mg total) by mouth 2 (two) times daily as needed for anxiety.  . Multiple Vitamins-Minerals (PRESERVISION AREDS PO) Take by mouth.  . NON FORMULARY 1 % every morning.  . NON FORMULARY daily.  . Olopatadine HCl 0.2 % SOLN Apply 1 drop to eye daily.  . traMADol (ULTRAM) 50 MG tablet Take 1 tablet (50 mg total) by mouth 4 (four) times daily. (Patient taking differently: Take 50 mg by mouth 4 (four) times daily. Two in the morning and 2 at night)  . DUREZOL 0.05 % EMUL Place 1 drop into the left eye 3 (three) times daily. (Patient not taking: Reported on 04/15/2021)  . PROLENSA 0.07 % SOLN Place 1 drop into the left eye at bedtime. (Patient not taking: Reported on 04/15/2021)   No facility-administered encounter medications on file as of 04/15/2021.    Allergies (verified) Prolia [denosumab] and Phenergan [promethazine hcl]   History: Past Medical History:  Diagnosis Date  . Allergy   . Arthritis   . 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  . Colon cancer ([place]) 2001  . 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 GERD (gastroesophageal reflux disease)   . History of ulcerative colitis 08/23/2018  . Osteoporosis 08/23/2018   Dexa 08/2017 lowest T=-2.8; vit d and calcium; rec tx, pt to think about it.    Past Surgical History:  Procedure Laterality Date  . ABDOMINAL HYSTERECTOMY    . APPENDECTOMY    .  BREAST SURGERY     Implants  . CERVICAL DISCECTOMY     Fusion   . HEMICOLECTOMY  2001   R- Cancer    Family History  Problem Relation Age of Onset  . Arthritis Mother   . Stroke Mother   . Prostate cancer Father   . Congestive Heart Failure Father   . Hypertension Father   . Restless legs syndrome Father   . Depression Son   . Restless legs syndrome Brother    Social History   Socioeconomic History  . Marital  status: Divorced    Spouse name: Not on file  . Number of children: 1  . Years of education: Not on file  . Highest education level: Not on file  Occupational History  . Occupation: retired, Office managerhealthcare HMO auditor etc  Tobacco Use  . Smoking status: Never Smoker  . Smokeless tobacco: Never Used  Vaping Use  . Vaping Use: Never used  Substance and Sexual Activity  . Alcohol use: Yes    Comment: rarely   . Drug use: Never  . Sexual activity: Not Currently    Birth control/protection: Post-menopausal  Other Topics Concern  . Not on file  Social History Narrative   [place] area [place] closer to son    Social Determinants of Corporate investment bankerHealth   Financial Resource Strain: Not on file  Food Insecurity: No Food Insecurity  . Worried About Programme researcher, broadcasting/film/videounning Out of Food in the Last Year: Never true  . Ran Out of Food in the Last Year: Never true  Transportation Needs: No Transportation Needs  . Lack of Transportation (Medical): No  . Lack of Transportation (Non-Medical): No  Physical Activity: Sufficiently Active  . Days of Exercise per Week: 5 days  . Minutes of Exercise per Session: 30 min  Stress: No Stress Concern Present  . Feeling of Stress : Not at all  Social Connections: Socially Isolated  . Frequency of Communication with Friends and Family: More than three times a week  . Frequency of Social Gatherings with Friends and Family: More than three times a week  . Attends Religious Services: Never  . Active Member of Clubs or Organizations: No  . Attends BankerClub or Organization Meetings: Never  . Marital Status: Divorced    Tobacco Counseling Counseling given: Not Answered   Clinical Intake:  Pre-visit preparation completed: Yes  Pain : No/denies pain     BMI - recorded: 21.35 Nutritional Status: BMI of 19-24  Normal Nutritional Risks: None Diabetes: No  How often do you need to have someone help you when you read instructions, pamphlets, or other written  materials from your doctor or pharmacy?: 1 - Never  Diabetic?No  Interpreter Needed?: No  Information entered by :: Lanier Ensignina Karine Garn, LPN   Activities of Daily Living In your present state of health, do you have any difficulty performing the following activities: 04/15/2021 03/03/2021  Hearing? N N  Vision? Y N  Comment cataract surgery scheduled for [place] 04/18/21 -  Difficulty concentrating or making decisions? N N  Walking or climbing stairs? Y N  Comment carefule -  Dressing or bathing? N N  Doing errands, shopping? N N  Preparing Food and eating ? N -  Using the Toilet? N -  In the past six months, have you accidently leaked urine? N -  Do you have problems with loss of bowel control? N -  Managing your Medications? N -  Managing your Finances? N -  Housekeeping or managing your Housekeeping? N -  Some recent data might be hidden    Patient Care Team: Leamon Arnt, MD as PCP - General (Family Medicine) Sherlynn Stalls, MD as Consulting Physician (Ophthalmology)  Indicate any recent Medical Services you may have received from other than [facility] providers in the past year (date may be approximate).     Assessment:   This is a routine wellness examination for [place].  Hearing/Vision screen  Hearing Screening   125Hz  250Hz  500Hz  1000Hz  2000Hz  3000Hz  4000Hz  6000Hz  8000Hz   Right ear:           Left ear:           Comments: Pt denies any hearing issues   Vision Screening Comments: Pt is seeing Dr Baird Cancer Dr Katherene Ponto for cataract surgery and will follow up for annual after   Dietary issues and exercise activities discussed: Current Exercise Habits: Home exercise routine, Type of exercise: walking, Time (Minutes): 30, Frequency (Times/Week): 5, Weekly Exercise (Minutes/Week): 150  Goals Addressed            This Visit's Progress   . Patient Stated       Getting a stationary bike       Depression Screen PHQ 2/9 Scores 04/15/2021 03/03/2021 03/12/2020 04/16/2019  10/16/2018 08/23/2018  PHQ - 2 Score 0 0 0 0 0 0    Fall Risk Fall Risk  04/15/2021 03/03/2021 06/11/2020 03/12/2020 12/10/2019  Falls in the past year? 0 0 0 0 0  Number falls in past yr: 0 0 0 0 -  Injury with Fall? 0 0 0 0 -  Risk for fall due to : Impaired vision;Impaired balance/gait - - - -  Risk for fall due to: Comment careful related to eyes going down steps - - - -  Follow up Falls prevention discussed - - Falls evaluation completed;Education provided;Falls prevention discussed Falls evaluation completed    FALL RISK PREVENTION PERTAINING TO THE HOME:  Any stairs in or around the home? No  If so, are there any without handrails? No  Home free of loose throw rugs in walkways, pet beds, electrical cords, etc? Yes  Adequate lighting in your home to reduce risk of falls? Yes   ASSISTIVE DEVICES UTILIZED TO PREVENT FALLS:  Life alert? No  Use of a cane, walker or w/c? No  Grab bars in the bathroom? Yes  Shower chair or bench in shower? No  Elevated toilet seat or a handicapped toilet? No   TIMED UP AND GO:  Was the test performed? No     Cognitive Function: MMSE - Mini Mental State Exam 10/16/2018  Orientation to time 4  Orientation to Place 5  Registration 3  Attention/ Calculation 5  Recall 2  Language- name 2 objects 2  Language- repeat 1  Language- follow 3 step command 3  Language- read & follow direction 1  Write a sentence 1  Copy design 1  Total score 28     6CIT Screen 04/15/2021 03/12/2020  What Year? 0 points 0 points  What month? 0 points 0 points  What time? - 0 points  Count back from 20 0 points 0 points  Months in reverse 0 points 0 points  Repeat phrase 0 points 0 points  Total Score - 0    Immunizations Immunization History  Administered Date(s) Administered  . Fluad Quad(high Dose 65+) 07/29/2019  . Influenza, High Dose Seasonal PF 08/23/2018  . Influenza-Unspecified 08/30/2020  . PFIZER Comirnaty(Gray Top)Covid-19  Tri-Sucrose Vaccine  03/21/2021  . PFIZER(Purple Top)SARS-COV-2 Vaccination 01/18/2020, 02/11/2020, 08/30/2020  . Pneumococcal Conjugate-13 12/10/2019  . Pneumococcal Polysaccharide-23 08/23/2018  . Tdap 08/07/2014  . Zoster 08/06/2013  . Zoster Recombinat (Shingrix) 09/26/2018, 01/13/2019    TDAP status: Up to date  Flu Vaccine status: Up to date  Pneumococcal vaccine status: Up to date  Covid-19 vaccine status: Completed vaccines  Qualifies for Shingles Vaccine? Yes   Zostavax completed Yes   Shingrix Completed?: Yes  Screening Tests Health Maintenance  Topic Date Due  . MAMMOGRAM  08/28/2018  . DEXA SCAN  08/29/2019  . COLONOSCOPY (Pts 45-19yrs Insurance coverage will need to be confirmed)  12/07/2020  . INFLUENZA VACCINE  06/27/2021  . TETANUS/TDAP  08/07/2024  . COVID-19 Vaccine  Completed  . Hepatitis C Screening  Completed  . PNA vac Low Risk Adult  Completed  . HPV VACCINES  Aged Out    Health Maintenance  Health Maintenance Due  Topic Date Due  . MAMMOGRAM  08/28/2018  . DEXA SCAN  08/29/2019  . COLONOSCOPY (Pts 45-40yrs Insurance coverage will need to be confirmed)  12/07/2020    Colorectal cancer screening: Type of screening: Colonoscopy. Completed 12/07/17. Repeat every 3 years  Mammogram status: Completed 08/28/17. Repeat every year  Bone Density status: Completed 08/28/17. Results reflect: Bone density results: OSTEOPOROSIS. Repeat every 2 years.   Additional Screening:  Hepatitis C Screening:  Completed 10/16/18  Vision Screening: Recommended annual ophthalmology exams for early detection of glaucoma and other disorders of the eye. Is the patient up to date with their annual eye exam?  Yes  Who is the provider or what is the name of the office in which the patient attends annual eye exams? Dr Baird Cancer Dr Truitt Leep  If pt is not established with a provider, would they like to be referred to a provider to establish care? No .   Dental Screening: Recommended annual  dental exams for proper oral hygiene  Community Resource Referral / Chronic Care Management: CRR required this visit?  No   CCM required this visit?  No      Plan:     I have personally reviewed and noted the following in the patient's chart:   . Medical and social history . Use of alcohol, tobacco or illicit drugs  . Current medications and supplements including opioid prescriptions.  . Functional ability and status . Nutritional status . Physical activity . Advanced directives . List of other physicians . Hospitalizations, surgeries, and ER visits in previous 12 months . Vitals . Screenings to include cognitive, depression, and falls . Referrals and appointments  In addition, I have reviewed and discussed with patient certain preventive protocols, quality metrics, and best practice recommendations. A written personalized care plan for preventive services as well as general preventive health recommendations were provided to patient.     Willette Brace, LPN   [id number]   Nurse Notes: None

## 2021-04-15 NOTE — Patient Instructions (Signed)
Ms. Chaudhuri , Thank you for taking time to come for your Medicare Wellness Visit. I appreciate your ongoing commitment to your health goals. Please review the following plan we discussed and let me know if I can assist you in the future.   Screening recommendations/referrals: Colonoscopy: Done 12/07/17 Mammogram: Done 08/28/17 Bone Density: Done 08/28/17 Recommended yearly ophthalmology/optometry visit for glaucoma screening and checkup Recommended yearly dental visit for hygiene and checkup  Vaccinations: Influenza vaccine: Up to date Pneumococcal vaccine: Up to date Tdap vaccine: Up to date Shingles vaccine: Done 09/26/18 & 01/13/19   Covid-19:Completed 2/21, 3/17, & 08/30/20  Advanced directives: Please bring a copy of your health care power of attorney and living will to the office at your convenience.  Conditions/risks identified: Get a stationary bike for exercise   Next appointment: Follow up in one year for your annual wellness visit    Preventive Care 65 Years and Older, Female Preventive care refers to lifestyle choices and visits with your health care provider that can promote health and wellness. What does preventive care include?  A yearly physical exam. This is also called an annual well check.  Dental exams once or twice a year.  Routine eye exams. Ask your health care provider how often you should have your eyes checked.  Personal lifestyle choices, including:  Daily care of your teeth and gums.  Regular physical activity.  Eating a healthy diet.  Avoiding tobacco and drug use.  Limiting alcohol use.  Practicing safe sex.  Taking low-dose aspirin every day.  Taking vitamin and mineral supplements as recommended by your health care provider. What happens during an annual well check? The services and screenings done by your health care provider during your annual well check will depend on your age, overall health, lifestyle risk factors, and family history  of disease. Counseling  Your health care provider may ask you questions about your:  Alcohol use.  Tobacco use.  Drug use.  Emotional well-being.  Home and relationship well-being.  Sexual activity.  Eating habits.  History of falls.  Memory and ability to understand (cognition).  Work and work Statistician.  Reproductive health. Screening  You may have the following tests or measurements:  Height, weight, and BMI.  Blood pressure.  Lipid and cholesterol levels. These may be checked every 5 years, or more frequently if you are over 71 years old.  Skin check.  Lung cancer screening. You may have this screening every year starting at age 74 if you have a 30-pack-year history of smoking and currently smoke or have quit within the past 15 years.  Fecal occult blood test (FOBT) of the stool. You may have this test every year starting at age 71.  Flexible sigmoidoscopy or colonoscopy. You may have a sigmoidoscopy every 5 years or a colonoscopy every 10 years starting at age 40.  Hepatitis C blood test.  Hepatitis B blood test.  Sexually transmitted disease (STD) testing.  Diabetes screening. This is done by checking your blood sugar (glucose) after you have not eaten for a while (fasting). You may have this done every 1-3 years.  Bone density scan. This is done to screen for osteoporosis. You may have this done starting at age 54.  Mammogram. This may be done every 1-2 years. Talk to your health care provider about how often you should have regular mammograms. Talk with your health care provider about your test results, treatment options, and if necessary, the need for more tests. Vaccines  Your  health care provider may recommend certain vaccines, such as:  Influenza vaccine. This is recommended every year.  Tetanus, diphtheria, and acellular pertussis (Tdap, Td) vaccine. You may need a Td booster every 10 years.  Zoster vaccine. You may need this after age  9.  Pneumococcal 13-valent conjugate (PCV13) vaccine. One dose is recommended after age 54.  Pneumococcal polysaccharide (PPSV23) vaccine. One dose is recommended after age 68. Talk to your health care provider about which screenings and vaccines you need and how often you need them. This information is not intended to replace advice given to you by your health care provider. Make sure you discuss any questions you have with your health care provider. Document Released: 12/10/2015 Document Revised: 08/02/2016 Document Reviewed: 09/14/2015 Elsevier Interactive Patient Education  2017 [place] Prevention in the Home Falls can cause injuries. They can happen to people of all ages. There are many things you can do to make your home safe and to help prevent falls. What can I do on the outside of my home?  Regularly fix the edges of walkways and driveways and fix any cracks.  Remove anything that might make you trip as you walk through a door, such as a raised step or threshold.  Trim any bushes or trees on the path to your home.  Use bright outdoor lighting.  Clear any walking paths of anything that might make someone trip, such as rocks or tools.  Regularly check to see if handrails are loose or broken. Make sure that both sides of any steps have handrails.  Any raised decks and porches should have guardrails on the edges.  Have any leaves, snow, or ice cleared regularly.  Use sand or salt on walking paths during winter.  Clean up any spills in your garage right away. This includes oil or grease spills. What can I do in the bathroom?  Use night lights.  Install grab bars by the toilet and in the tub and shower. Do not use towel bars as grab bars.  Use non-skid mats or decals in the tub or shower.  If you need to sit down in the shower, use a plastic, non-slip stool.  Keep the floor dry. Clean up any water that spills on the floor as soon as it happens.  Remove  soap buildup in the tub or shower regularly.  Attach bath mats securely with double-sided non-slip rug tape.  Do not have throw rugs and other things on the floor that can make you trip. What can I do in the bedroom?  Use night lights.  Make sure that you have a light by your bed that is easy to reach.  Do not use any sheets or blankets that are too big for your bed. They should not hang down onto the floor.  Have a firm chair that has side arms. You can use this for support while you get dressed.  Do not have throw rugs and other things on the floor that can make you trip. What can I do in the kitchen?  Clean up any spills right away.  Avoid walking on wet floors.  Keep items that you use a lot in easy-to-reach places.  If you need to reach something above you, use a strong step stool that has a grab bar.  Keep electrical cords out of the way.  Do not use floor polish or wax that makes floors slippery. If you must use wax, use non-skid floor wax.  Do not  have throw rugs and other things on the floor that can make you trip. What can I do with my stairs?  Do not leave any items on the stairs.  Make sure that there are handrails on both sides of the stairs and use them. Fix handrails that are broken or loose. Make sure that handrails are as long as the stairways.  Check any carpeting to make sure that it is firmly attached to the stairs. Fix any carpet that is loose or worn.  Avoid having throw rugs at the top or bottom of the stairs. If you do have throw rugs, attach them to the floor with carpet tape.  Make sure that you have a light switch at the top of the stairs and the bottom of the stairs. If you do not have them, ask someone to add them for you. What else can I do to help prevent falls?  Wear shoes that:  Do not have high heels.  Have rubber bottoms.  Are comfortable and fit you well.  Are closed at the toe. Do not wear sandals.  If you use a  stepladder:  Make sure that it is fully opened. Do not climb a closed stepladder.  Make sure that both sides of the stepladder are locked into place.  Ask someone to hold it for you, if possible.  Clearly mark and make sure that you can see:  Any grab bars or handrails.  First and last steps.  Where the edge of each step is.  Use tools that help you move around (mobility aids) if they are needed. These include:  Canes.  Walkers.  Scooters.  Crutches.  Turn on the lights when you go into a dark area. Replace any light bulbs as soon as they burn out.  Set up your furniture so you have a clear path. Avoid moving your furniture around.  If any of your floors are uneven, fix them.  If there are any pets around you, be aware of where they are.  Review your medicines with your doctor. Some medicines can make you feel dizzy. This can increase your chance of falling. Ask your doctor what other things that you can do to help prevent falls. This information is not intended to replace advice given to you by your health care provider. Make sure you discuss any questions you have with your health care provider. Document Released: 09/09/2009 Document Revised: 04/20/2016 Document Reviewed: 12/18/2014 Elsevier Interactive Patient Education  2017 [employer].

## 2021-04-18 DIAGNOSIS — H2512 Age-related nuclear cataract, left eye: Secondary | ICD-10-CM | POA: Diagnosis not present

## 2021-04-19 DIAGNOSIS — H2511 Age-related nuclear cataract, right eye: Secondary | ICD-10-CM | POA: Diagnosis not present

## 2021-05-09 DIAGNOSIS — H2511 Age-related nuclear cataract, right eye: Secondary | ICD-10-CM | POA: Diagnosis not present

## 2021-05-20 ENCOUNTER — Other Ambulatory Visit: Payer: Self-pay

## 2021-05-20 ENCOUNTER — Encounter: Payer: Self-pay | Admitting: Family Medicine

## 2021-05-20 MED ORDER — LORAZEPAM 1 MG PO TABS: 1.0000 mg | ORAL_TABLET | Freq: Two times a day (BID) | ORAL | 5 refills | Status: DC | PRN

## 2021-05-20 NOTE — Telephone Encounter (Signed)
Last refill 12/15/2020 Last OV 03/03/2021 dx spondylosis

## 2021-05-23 MED ORDER — LORAZEPAM 1 MG PO TABS: 1.0000 mg | ORAL_TABLET | Freq: Two times a day (BID) | ORAL | 5 refills | Status: DC | PRN

## 2021-05-23 MED ORDER — TRAMADOL HCL 50 MG PO TABS: 100.0000 mg | ORAL_TABLET | Freq: Two times a day (BID) | ORAL | 5 refills | Status: DC

## 2021-06-09 ENCOUNTER — Other Ambulatory Visit: Payer: Self-pay

## 2021-06-09 ENCOUNTER — Ambulatory Visit
Admission: RE | Admit: 2021-06-09 | Discharge: 2021-06-09 | Disposition: A | Discharge status: Home / Self Care | Payer: Medicare Other | Source: Ambulatory Visit | Attending: Family Medicine | Admitting: Family Medicine

## 2021-06-09 DIAGNOSIS — Z1231 Encounter for screening mammogram for malignant neoplasm of breast: Secondary | ICD-10-CM | POA: Diagnosis not present

## 2021-06-09 DIAGNOSIS — M810 Age-related osteoporosis without current pathological fracture: Secondary | ICD-10-CM

## 2021-06-09 DIAGNOSIS — Z780 Asymptomatic menopausal state: Secondary | ICD-10-CM | POA: Diagnosis not present

## 2021-06-17 ENCOUNTER — Encounter: Payer: Self-pay | Admitting: Family Medicine

## 2021-06-29 DIAGNOSIS — H353221 Exudative age-related macular degeneration, left eye, with active choroidal neovascularization: Secondary | ICD-10-CM | POA: Diagnosis not present

## 2021-06-29 DIAGNOSIS — H43811 Vitreous degeneration, right eye: Secondary | ICD-10-CM | POA: Diagnosis not present

## 2021-06-29 DIAGNOSIS — H353111 Nonexudative age-related macular degeneration, right eye, early dry stage: Secondary | ICD-10-CM | POA: Diagnosis not present

## 2021-07-01 ENCOUNTER — Encounter: Payer: Self-pay | Admitting: Family Medicine

## 2021-08-25 DIAGNOSIS — Z23 Encounter for immunization: Secondary | ICD-10-CM | POA: Diagnosis not present

## 2021-09-02 ENCOUNTER — Encounter: Payer: Self-pay | Admitting: Family Medicine

## 2021-09-02 ENCOUNTER — Other Ambulatory Visit: Payer: Self-pay

## 2021-09-02 ENCOUNTER — Ambulatory Visit (INDEPENDENT_AMBULATORY_CARE_PROVIDER_SITE_OTHER): Payer: Medicare Other | Admitting: Family Medicine

## 2021-09-02 VITALS — BP 130/80 | HR 69 | Temp 98.2°F | Ht 64.0 in | Wt 121.4 lb

## 2021-09-02 DIAGNOSIS — G4701 Insomnia due to medical condition: Secondary | ICD-10-CM

## 2021-09-02 DIAGNOSIS — M810 Age-related osteoporosis without current pathological fracture: Secondary | ICD-10-CM | POA: Diagnosis not present

## 2021-09-02 DIAGNOSIS — M542 Cervicalgia: Secondary | ICD-10-CM

## 2021-09-02 DIAGNOSIS — M47812 Spondylosis without myelopathy or radiculopathy, cervical region: Secondary | ICD-10-CM

## 2021-09-02 DIAGNOSIS — Z79899 Other long term (current) drug therapy: Secondary | ICD-10-CM

## 2021-09-02 DIAGNOSIS — Z85038 Personal history of other malignant neoplasm of large intestine: Secondary | ICD-10-CM | POA: Diagnosis not present

## 2021-09-02 DIAGNOSIS — G8929 Other chronic pain: Secondary | ICD-10-CM | POA: Diagnosis not present

## 2021-09-02 NOTE — Patient Instructions (Addendum)
Please return in 6 months for your annual complete physical; please come fasting.   Please Call [facility] to set up appointment to discuss further colon cancer surveillance.  [phone]  If you have any questions or concerns, please don't hesitate to send me a message via MyChart or call the office at [phone]. Thank you for visiting with [place] today! It's our pleasure caring for you.

## 2021-09-07 NOTE — Progress Notes (Signed)
Subjective  CC:  Chief Complaint  Patient presents with   Pain    HPI: Haley Huerta is a 70 y.o. female who presents to the office today to address the problems listed above in the chief complaint. F/u for chronic pain, insomnia and osteoporosis. Continues to manage pain with tramadol. I reviewed patient's records from the PMP aware controlled substance registry today.  Sleep is managed with ativan. Has had trouble getting last refill due to timing/date.no Aes. No falls Reviewed most recent dexa. See summary below. Pt is hesitant to treat H/o colon cancer. Due for crc surveillance.    Assessment  1. Spondylosis of cervical region without myelopathy or radiculopathy   2. Chronic neck pain   3. Age-related osteoporosis without current pathological fracture   4. History of colon cancer- cecal adenocarcinoma   5. Chronic use of benzodiazepine for therapeutic purpose   6. Insomnia secondary to chronic pain      Plan  Chronic neck pain on tramadol:  stable. Refilled Ativan for sleep Osteoporosis: Dexa 08/2017 lowest T=-2.8; vit d and calcium; rec tx, to start prolia 09/2018- had allergic reaction to prolia.Dexa 2021 ordered: rec evista.Dexa 2022: T = -2.9 lumbar. Pt failed biphosphanates and prolia. Declines evista 08/2021: prefers to reassess in 2 years. Declines endocrine consult for further discussion at this time.  Refer to GI; encouraged to get recommended screening done or discuss risk/benefit with specialist.   Follow up: 76mo for cpe  Visit date not found  No orders of the defined types were placed in this encounter.  No orders of the defined types were placed in this encounter.     I reviewed the patients updated PMH, FH, and SocHx.    Patient Active Problem List   Diagnosis Date Noted   Insomnia secondary to chronic pain 08/23/2018    Priority: 1.   History of colon cancer- cecal adenocarcinoma 08/23/2018    Priority: 1.   S/P left hemicolectomy 08/23/2018     Priority: 1.   DJD (degenerative joint disease) of cervical spine 08/23/2018    Priority: 1.   Osteoporosis 08/23/2018    Priority: 1.   Chronic use of benzodiazepine for therapeutic purpose 08/23/2018    Priority: 1.   History of ulcerative colitis 08/23/2018    Priority: 2.   Macular degeneration 08/23/2018    Priority: 3.   Current Meds  Medication Sig   diphenhydrAMINE (BENADRYL) 25 MG tablet Take 25 mg by mouth every 6 (six) hours as needed.   fluticasone (FLONASE) 50 MCG/ACT nasal spray Place into both nostrils daily.   LORazepam (ATIVAN) 1 MG tablet Take 1 tablet (1 mg total) by mouth 2 (two) times daily as needed for anxiety.   Multiple Vitamins-Minerals (PRESERVISION AREDS PO) Take by mouth.   NON FORMULARY 1 % every morning.   NON FORMULARY daily.   Olopatadine HCl 0.2 % SOLN Apply 1 drop to eye daily.   traMADol (ULTRAM) 50 MG tablet Take 2 tablets (100 mg total) by mouth 2 (two) times daily.    Allergies: Patient is allergic to prolia [denosumab] and phenergan [promethazine hcl]. Family History: Patient family history includes Arthritis in her mother; Congestive Heart Failure in her father; Depression in her son; Hypertension in her father; Prostate cancer in her father; Restless legs syndrome in her brother and father; Stroke in her mother. Social History:  Patient  reports that she has never smoked. She has never used smokeless tobacco. She reports current alcohol use. She  reports that she does not use drugs.  Review of Systems: Constitutional: Negative for fever malaise or anorexia Cardiovascular: negative for chest pain Respiratory: negative for SOB or persistent cough Gastrointestinal: negative for abdominal pain  Objective  Vitals: BP 130/80   Pulse 69   Temp 98.2 F (36.8 C) (Temporal)   Ht 5\' 4"  (1.626 m)   Wt 121 lb 6.4 oz (55.1 kg)   SpO2 99%   BMI 20.84 kg/m  General: no acute distress , A&Ox3 HEENT: PEERL, conjunctiva normal, neck is  supple Cardiovascular:  RRR without murmur or gallop.  Respiratory:  Good breath sounds bilaterally, CTAB with normal respiratory effort Skin:  Warm, no rashes    Commons side effects, risks, benefits, and alternatives for medications and treatment plan prescribed today were discussed, and the patient expressed understanding of the given instructions. Patient is instructed to call or message via MyChart if he/she has any questions or concerns regarding our treatment plan. No barriers to understanding were identified. We discussed Red Flag symptoms and signs in detail. Patient expressed understanding regarding what to do in case of urgent or emergency type symptoms.  Medication list was reconciled, printed and provided to the patient in AVS. Patient instructions and summary information was reviewed with the patient as documented in the AVS. This note was prepared with assistance of Dragon voice recognition software. Occasional wrong-word or sound-a-like substitutions may have occurred due to the inherent limitations of voice recognition software  This visit occurred during the SARS-CoV-2 public health emergency.  Safety protocols were in place, including screening questions prior to the visit, additional usage of staff PPE, and extensive cleaning of exam room while observing appropriate contact time as indicated for disinfecting solutions.

## 2021-09-14 DIAGNOSIS — H353221 Exudative age-related macular degeneration, left eye, with active choroidal neovascularization: Secondary | ICD-10-CM | POA: Diagnosis not present

## 2021-09-14 DIAGNOSIS — H353111 Nonexudative age-related macular degeneration, right eye, early dry stage: Secondary | ICD-10-CM | POA: Diagnosis not present

## 2021-09-14 DIAGNOSIS — H43811 Vitreous degeneration, right eye: Secondary | ICD-10-CM | POA: Diagnosis not present

## 2021-09-16 ENCOUNTER — Encounter: Payer: Self-pay | Admitting: Gastroenterology

## 2021-10-09 IMAGING — MG DIGITAL SCREENING BREAST BILAT IMPLANT W/ TOMO W/ CAD
8 of 16 series · 8 of 40 positions shown · non-contrast
Comparison: None.
COMPARISON: None.

Addendum:
CLINICAL DATA: Screening.

EXAM:
DIGITAL SCREENING BILATERAL MAMMOGRAM WITH IMPLANTS, CAD AND
TOMOSYNTHESIS
TECHNIQUE: Bilateral screening digital craniocaudal and mediolateral oblique
mammograms were obtained. Bilateral screening digital breast
tomosynthesis was performed. The images were evaluated with
computer-aided detection. Standard and/or implant displaced views
were performed.

[R CC]
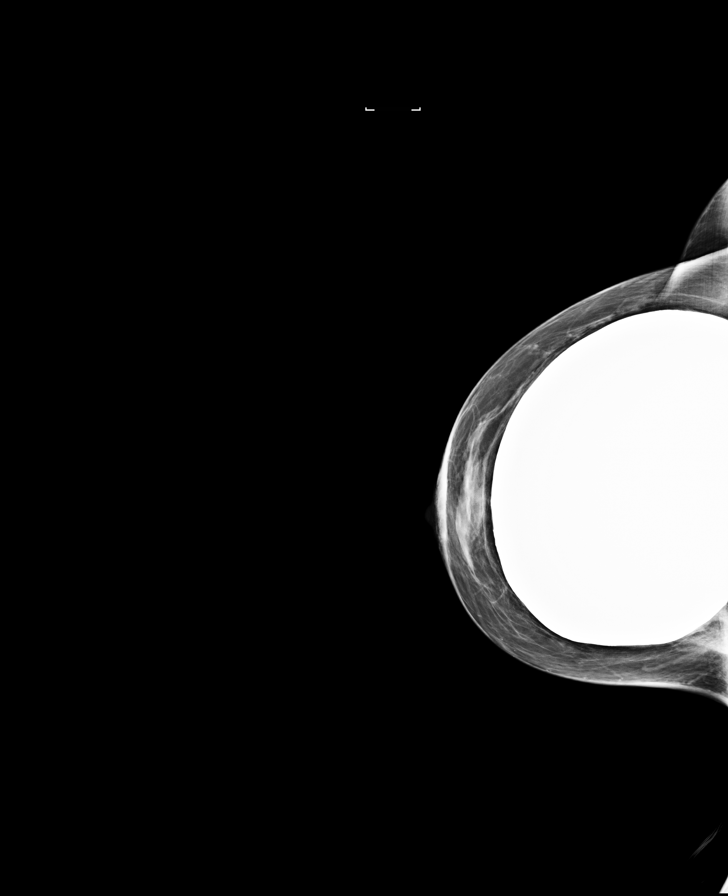

[L MLO]
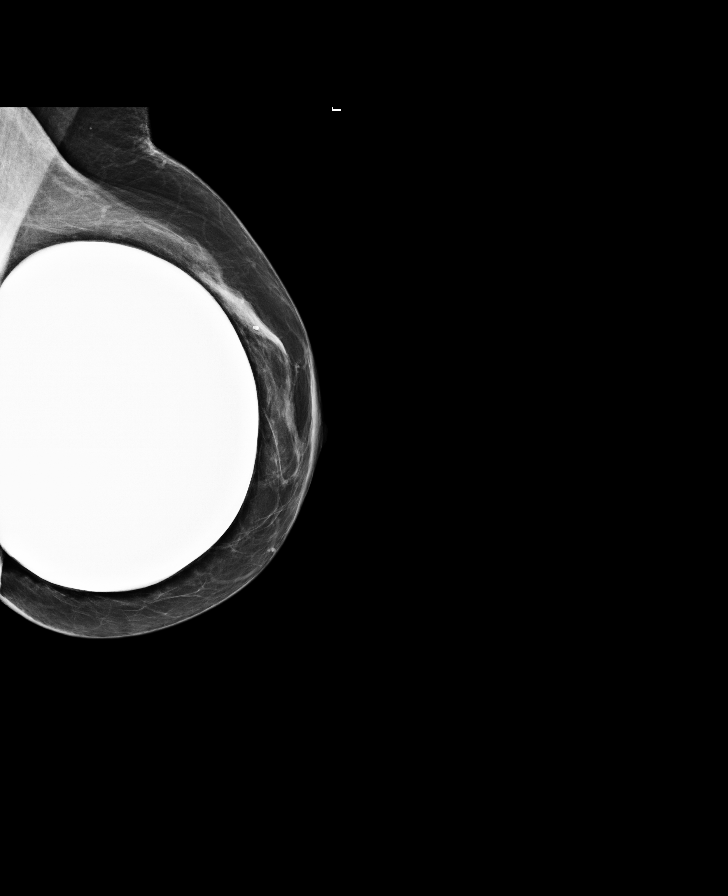

[R MLO]
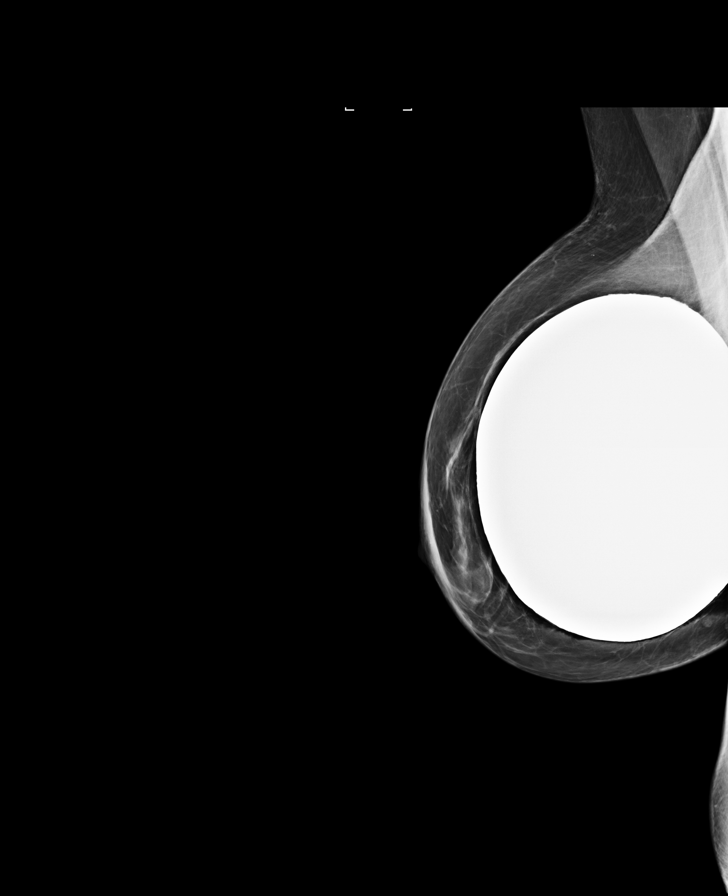

[L CC]
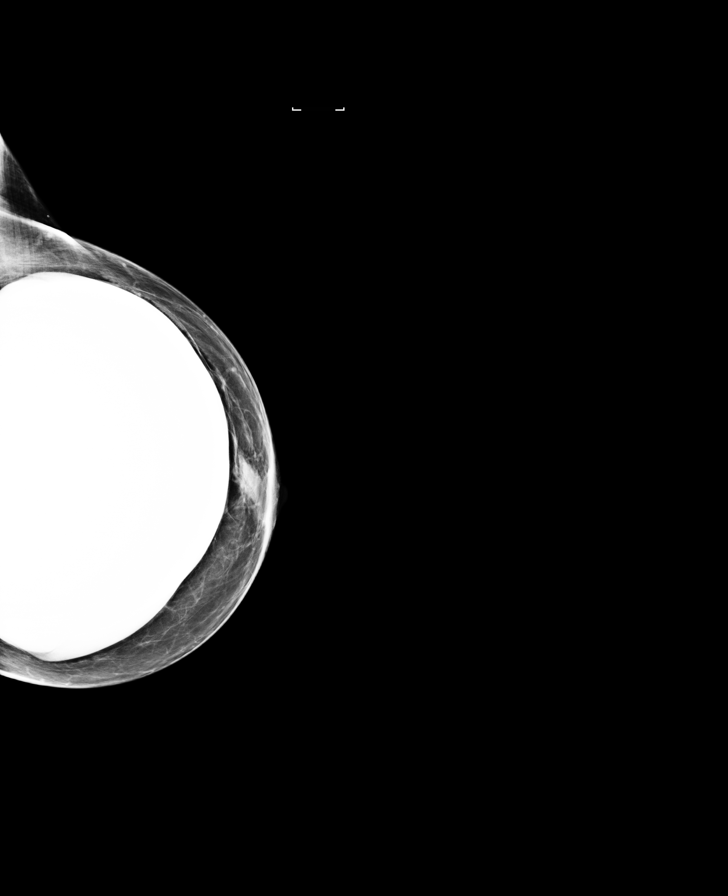

[L CC synth-2D]
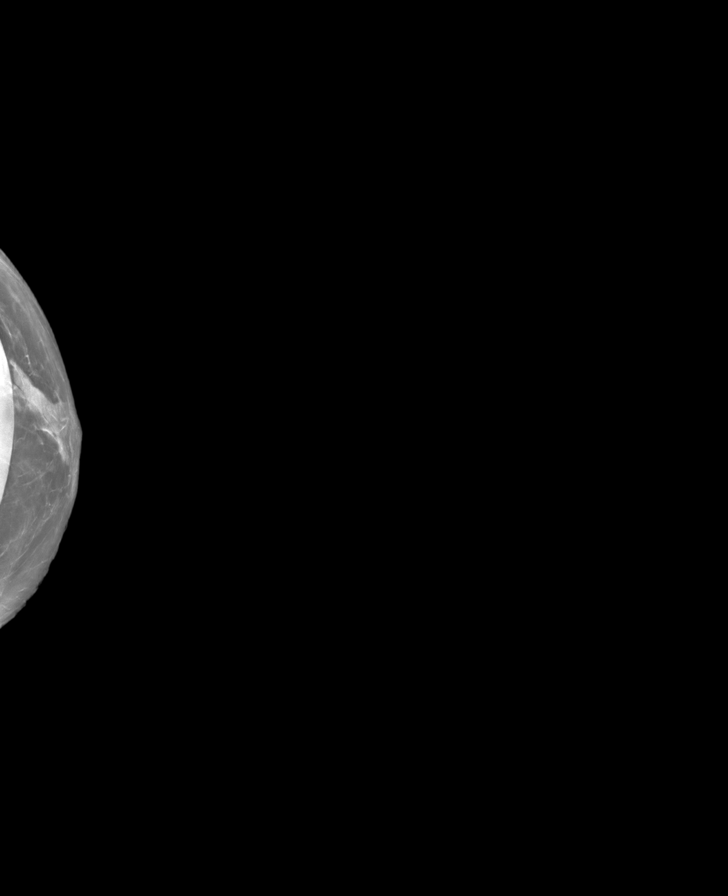

[R MLO synth-2D]
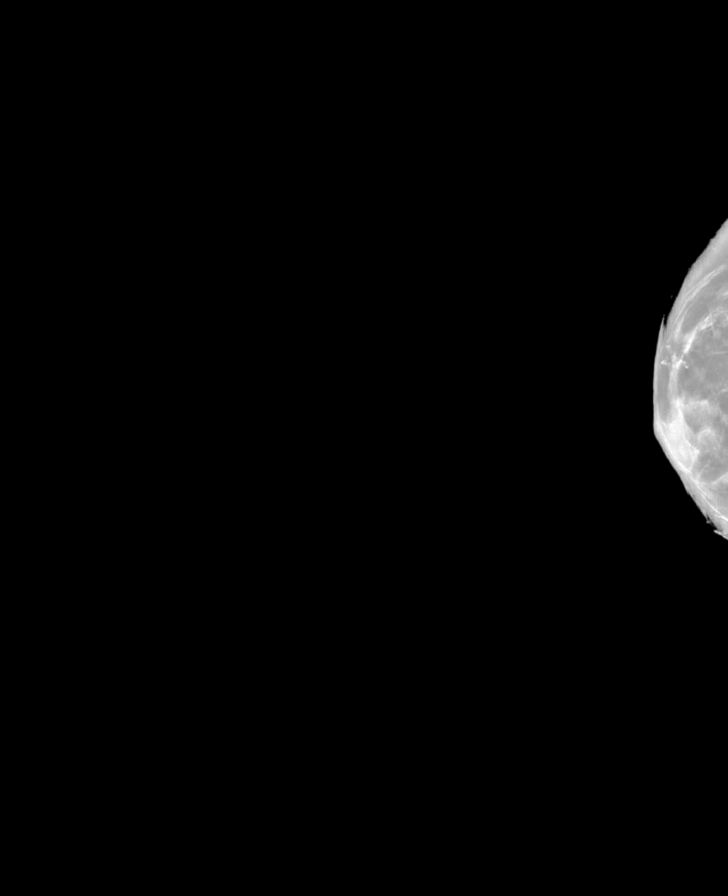

[L MLO synth-2D (1 of 2)]
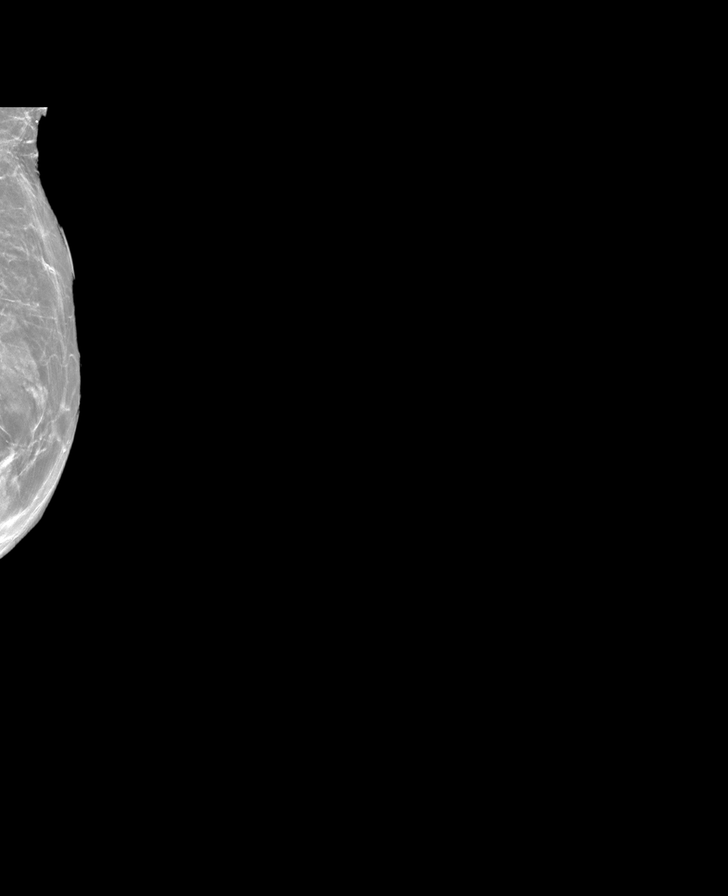

[L MLO synth-2D (2 of 2)]
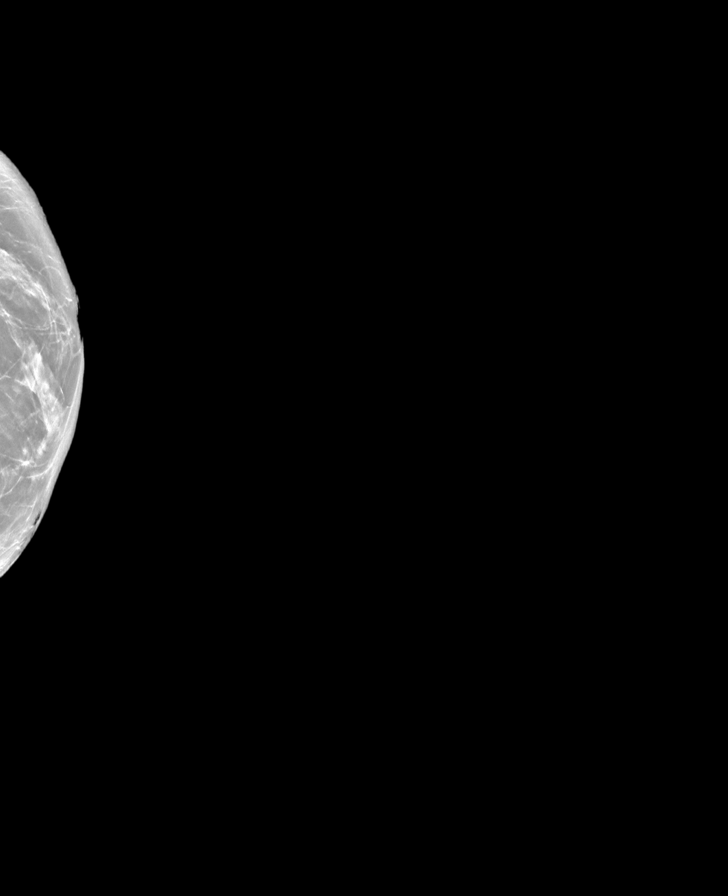

[8 of 40 positions shown; findings below may reference images not displayed]

ACR Breast Density Category b: There are scattered areas of
fibroglandular density.
FINDINGS: The patient has retropectoral implants. There are no findings
suspicious for malignancy.
IMPRESSION: No mammographic evidence of malignancy. A result letter of this
screening mammogram will be mailed directly to the patient.

RECOMMENDATION:
Screening mammogram in one year. (Code:M9-6-2Q0)

BI-RADS CATEGORY  1:  Negative.

ADDENDUM:
Prior outside studies are now available.
IMPRESSION: No mammographic evidence of malignancy.

BI-RADS category 1-negative

*** End of Addendum ***
ACR Breast Density Category b: There are scattered areas of
fibroglandular density.
FINDINGS: The patient has retropectoral implants. There are no findings
suspicious for malignancy.
IMPRESSION: No mammographic evidence of malignancy. A result letter of this
screening mammogram will be mailed directly to the patient.

RECOMMENDATION:
Screening mammogram in one year. (Code:M9-6-2Q0)

BI-RADS CATEGORY  1:  Negative.

## 2021-10-10 ENCOUNTER — Ambulatory Visit (INDEPENDENT_AMBULATORY_CARE_PROVIDER_SITE_OTHER): Payer: Medicare Other | Admitting: Gastroenterology

## 2021-10-10 ENCOUNTER — Encounter: Payer: Self-pay | Admitting: Gastroenterology

## 2021-10-10 VITALS — BP 112/64 | HR 83 | Ht 64.0 in | Wt 116.6 lb

## 2021-10-10 DIAGNOSIS — R1084 Generalized abdominal pain: Secondary | ICD-10-CM

## 2021-10-10 DIAGNOSIS — Z85038 Personal history of other malignant neoplasm of large intestine: Secondary | ICD-10-CM | POA: Diagnosis not present

## 2021-10-10 DIAGNOSIS — K219 Gastro-esophageal reflux disease without esophagitis: Secondary | ICD-10-CM | POA: Diagnosis not present

## 2021-10-10 MED ORDER — DICYCLOMINE HCL 20 MG PO TABS: 20.0000 mg | ORAL_TABLET | Freq: Four times a day (QID) | ORAL | 1 refills | Status: DC

## 2021-10-10 NOTE — Patient Instructions (Signed)
If you are age 72 or older, your body mass index should be between 23-30. Your Body mass index is 20.01 kg/m. If this is out of the aforementioned range listed, please consider follow up with your Primary Care Provider.  If you are age 44 or younger, your body mass index should be between 19-25. Your Body mass index is 20.01 kg/m. If this is out of the aformentioned range listed, please consider follow up with your Primary Care Provider.   We have sent the following medications to your pharmacy for you to pick up at your convenience: Bentyl 20 mg  Over the counter Pepcid. IB guard as needed  The [facility] providers would like to encourage you to use [facility] to communicate with providers for non-urgent requests or questions.  Due to long hold times on the telephone, sending your provider a message by [facility] may be a faster and more efficient way to get a response.  Please allow 48 business hours for a response.  Please remember that this is for non-urgent requests.   It was a pleasure to see you today!  Thank you for trusting me with your gastrointestinal care!    Scott E.Candis Schatz, MD

## 2021-10-10 NOTE — Progress Notes (Signed)
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 Billey Chang to establish care.  The patient states she was treated with surgery, chemotherapy and radiation and has been free of disease since her initial treatment.  She has undergone regular surveillance colonoscopies and has not had any polyps.  Her last colonoscopy was in 2019.  She states that she has had inadequate bowel preps in the past, and th GoLytely seems to be the only bowel prep that works well for her. She has regular bowel movements, sometimes going a day without one, but does have episodic abdominal pain characterized by rather significant crampy abdominal pain, associated with chills/diaphoresis.  Sometimes lasting 24-48 hours.  This pain tends to be related to her diet and she reports that corn is a reliable precipitant of the pain.  These episodes occur maybe once a month or less.  She takes Ultram when she has this pain.  She has occasional symptoms of nausea and vomiting, which are also infrequent (4 episodes over the past 6 months).  More frequently she will have nausea in the mornings without vomiting.  She was previously prescribed omeprazole for this, but she didn't tolerate it.   Past Medical History:  Diagnosis Date   Allergy    Anxiety    Arthritis    Chronic use of benzodiazepine for therapeutic purpose 08/23/2018   Uses for neck pain, muscle tension (failed tricyclics, mm relaxers) and neck pain.   Colon cancer ([place]) 2001   DJD (degenerative joint disease) of cervical spine 08/23/2018   Chronic pain on tramadol   GERD (gastroesophageal reflux disease)    History of ulcerative colitis 08/23/2018   Macular degeneration    Osteoporosis 08/23/2018   Dexa 08/2017 lowest T=-2.8; vit d and calcium; rec tx, pt to think about it.      Past Surgical History:  Procedure Laterality Date    ABDOMINAL HYSTERECTOMY     APPENDECTOMY     BREAST SURGERY     Implants   CATARACT EXTRACTION Bilateral    CERVICAL DISCECTOMY     Fusion    HEMICOLECTOMY  2001   R- Cancer    Family History  Problem Relation Age of Onset   Arthritis Mother    Stroke Mother    Prostate cancer Father    Congestive Heart Failure Father    Hypertension Father    Restless legs syndrome Father    Depression Son    Restless legs syndrome Brother    Social History   Tobacco Use   Smoking status: Never   Smokeless tobacco: Never  Vaping Use   Vaping Use: Never used  Substance Use Topics   Alcohol use: Yes    Comment: rarely    Drug use: Never   Current Outpatient Medications  Medication Sig Dispense Refill   diphenhydrAMINE (BENADRYL) 25 MG tablet Take 25 mg by mouth every 6 (six) hours as needed.     fluticasone (FLONASE) 50 MCG/ACT nasal spray Place into both nostrils daily.     LORazepam (ATIVAN) 1 MG tablet Take 1 tablet (1 mg total) by mouth 2 (two) times daily as needed for anxiety. 45 tablet 5   Multiple Vitamins-Minerals (PRESERVISION AREDS PO) Take by mouth.     NON FORMULARY 1 % every morning.     NON FORMULARY daily.     traMADol (ULTRAM) 50  MG tablet Take 2 tablets (100 mg total) by mouth 2 (two) times daily. 120 tablet 5   No current facility-administered medications for this visit.   Allergies  Allergen Reactions   Prolia [Denosumab] Itching and Dermatitis    Itching and facial rash   Phenergan [Promethazine Hcl] Other (See Comments)    Restless legs   Partial endoscopic history 2019: Colonoscopy with normal-appearing ileocolonic anastomosis, no polyps  2015: Colonoscopy with normal-appearing ileocolonic anastomosis, no polyps  2008: Colonoscopy with normal-appearing ileocolonic anastomosis, biopsies with evidence of inflammatory changes without chronicity  2001:  Mucinous adenocarcinoma with angiolymphatic invasion arising from the cecum and localized positive lymph  nodes  Review of Systems: All systems reviewed and negative except where noted in HPI.    No results found.  Physical Exam: BP 112/64   Pulse 83   Ht 5\' 4"  (1.626 m)   Wt 116 lb 9.6 oz (52.9 kg)   SpO2 98%   BMI 20.01 kg/m  Constitutional: Pleasant,well-developed, Caucasian female in no acute distress. HEENT: Normocephalic and atraumatic. Conjunctivae are normal. No scleral icterus.  Cardiovascular: Normal rate, regular rhythm.  Pulmonary/chest: Effort normal and breath sounds normal. No wheezing, rales or rhonchi. Abdominal: Soft, nondistended, nontender. Bowel sounds active throughout. There are no masses palpable. No hepatomegaly. Extremities: no edema Neurological: Alert and oriented to person place and time. Skin: Skin is warm and dry. No rashes noted. Psychiatric: Normal mood and affect. Behavior is normal.  CBC    Component Value Date/Time   WBC 5.0 03/03/2021 1019   RBC 4.27 03/03/2021 1019   HGB 13.6 03/03/2021 1019   HCT 40.8 03/03/2021 1019   PLT 255.0 03/03/2021 1019   MCV 95.7 03/03/2021 1019   MCHC 33.4 03/03/2021 1019   RDW 13.7 03/03/2021 1019   LYMPHSABS 1.5 03/03/2021 1019   MONOABS 0.5 03/03/2021 1019   EOSABS 0.1 03/03/2021 1019   BASOSABS 0.0 03/03/2021 1019    CMP     Component Value Date/Time   NA 139 03/03/2021 1019   NA 144 08/28/2017 0000   K 3.9 03/03/2021 1019   CL 102 03/03/2021 1019   CO2 28 03/03/2021 1019   GLUCOSE 74 03/03/2021 1019   BUN 23 03/03/2021 1019   BUN 18 08/28/2017 0000   CREATININE 1.11 03/03/2021 1019   CALCIUM 9.5 03/03/2021 1019   PROT 6.8 03/03/2021 1019   ALBUMIN 4.2 03/03/2021 1019   AST 21 03/03/2021 1019   ALT 16 03/03/2021 1019   ALKPHOS 61 03/03/2021 1019   BILITOT 0.7 03/03/2021 1019     ASSESSMENT AND PLAN: 70 year old female with remote history of mucinous adenocarcinoma (unclear if cecal vs appendiceal in origin) s/p R hemicolectomy/chemo/radiation with no evidence of disease x 20+ years,  with normal endoscopic surveillance, most recently in 2019.  She should continue surveillance colonoscopies every 5 years, so she will be due in 2024.  She has a long history of episodic abdominal pain lasting 24-48 hours, usually food related, with corn being a reliable culprit.  I suspect that this may be related to surgical adhesive disease.  I recommended she try Bentyl or IBGard with her next episode.  If her episodes occur more frequently, she may need to try a low residue diet.  She has some symptoms suggestive of GERD (morning nausea, occasional regurgitation), previously intolerant to omeprazole.  She also has osteoporosis, would want to avoid PPI if possible.  I recommended she try taking Pepcid as needed when she has symptoms,  or even try it nightly if her morning nausea becomes more frequent.  Episodic abdominal pain, possibly related to surgical adhesive disease - Bentyl or IBGard PRN - Consider low residue diet if symptoms become more frequent  GERD - Pepcid PRN  History of colon cancer - Surveillance colonoscopy due Jan 2024 (needs GoLytely for adequate prep)  Kalief Kattner E. Candis Schatz, MD [facility]  CC:  Leamon Arnt, MD

## 2021-10-12 ENCOUNTER — Encounter: Payer: Self-pay | Admitting: Gastroenterology

## 2021-11-15 ENCOUNTER — Encounter: Payer: Self-pay | Admitting: Family Medicine

## 2021-11-16 ENCOUNTER — Other Ambulatory Visit: Payer: Self-pay

## 2021-11-16 MED ORDER — TRAMADOL HCL 50 MG PO TABS: 100.0000 mg | ORAL_TABLET | Freq: Two times a day (BID) | ORAL | 5 refills | Status: DC

## 2021-11-16 MED ORDER — LORAZEPAM 1 MG PO TABS: 1.0000 mg | ORAL_TABLET | Freq: Two times a day (BID) | ORAL | 5 refills | Status: DC | PRN

## 2021-11-16 NOTE — Telephone Encounter (Signed)
Last OV 09/02/2021 dx spondylosis Last Refill 05/23/2021

## 2021-12-06 DIAGNOSIS — H353221 Exudative age-related macular degeneration, left eye, with active choroidal neovascularization: Secondary | ICD-10-CM | POA: Diagnosis not present

## 2021-12-06 DIAGNOSIS — H353111 Nonexudative age-related macular degeneration, right eye, early dry stage: Secondary | ICD-10-CM | POA: Diagnosis not present

## 2021-12-06 DIAGNOSIS — H43813 Vitreous degeneration, bilateral: Secondary | ICD-10-CM | POA: Diagnosis not present

## 2022-01-11 DIAGNOSIS — H353221 Exudative age-related macular degeneration, left eye, with active choroidal neovascularization: Secondary | ICD-10-CM | POA: Diagnosis not present

## 2022-02-08 DIAGNOSIS — H353111 Nonexudative age-related macular degeneration, right eye, early dry stage: Secondary | ICD-10-CM | POA: Diagnosis not present

## 2022-02-08 DIAGNOSIS — H43813 Vitreous degeneration, bilateral: Secondary | ICD-10-CM | POA: Diagnosis not present

## 2022-02-08 DIAGNOSIS — H353221 Exudative age-related macular degeneration, left eye, with active choroidal neovascularization: Secondary | ICD-10-CM | POA: Diagnosis not present

## 2022-03-07 ENCOUNTER — Ambulatory Visit: Payer: Medicare Other | Admitting: Family Medicine

## 2022-03-11 ENCOUNTER — Encounter: Payer: Self-pay | Admitting: Family Medicine

## 2022-03-15 ENCOUNTER — Encounter: Payer: Self-pay | Admitting: Family Medicine

## 2022-03-15 ENCOUNTER — Ambulatory Visit (INDEPENDENT_AMBULATORY_CARE_PROVIDER_SITE_OTHER): Payer: Medicare Other | Admitting: Family Medicine

## 2022-03-15 VITALS — BP 144/80 | HR 67 | Temp 98.1°F | Ht 64.0 in | Wt 121.0 lb

## 2022-03-15 DIAGNOSIS — Z85038 Personal history of other malignant neoplasm of large intestine: Secondary | ICD-10-CM | POA: Diagnosis not present

## 2022-03-15 DIAGNOSIS — Z79899 Other long term (current) drug therapy: Secondary | ICD-10-CM

## 2022-03-15 DIAGNOSIS — J301 Allergic rhinitis due to pollen: Secondary | ICD-10-CM

## 2022-03-15 DIAGNOSIS — E559 Vitamin D deficiency, unspecified: Secondary | ICD-10-CM

## 2022-03-15 DIAGNOSIS — Z9049 Acquired absence of other specified parts of digestive tract: Secondary | ICD-10-CM

## 2022-03-15 DIAGNOSIS — M47812 Spondylosis without myelopathy or radiculopathy, cervical region: Secondary | ICD-10-CM

## 2022-03-15 DIAGNOSIS — M810 Age-related osteoporosis without current pathological fracture: Secondary | ICD-10-CM | POA: Diagnosis not present

## 2022-03-15 DIAGNOSIS — G4701 Insomnia due to medical condition: Secondary | ICD-10-CM

## 2022-03-15 DIAGNOSIS — G8929 Other chronic pain: Secondary | ICD-10-CM

## 2022-03-15 LAB — COMPREHENSIVE METABOLIC PANEL
ALT: 10 U/L (ref 0–35)
AST: 18 U/L (ref 0–37)
Albumin: 3.8 g/dL (ref 3.5–5.2)
Alkaline Phosphatase: 53 U/L (ref 39–117)
BUN: 19 mg/dL (ref 6–23)
CO2: 29 mEq/L (ref 19–32)
Calcium: 8.9 mg/dL (ref 8.4–10.5)
Chloride: 102 mEq/L (ref 96–112)
Creatinine, Ser: 0.9 mg/dL (ref 0.40–1.20)
GFR: 63.92 mL/min (ref >60.00)
Glucose, Bld: 85 mg/dL (ref 70–99)
Potassium: 3.9 mEq/L (ref 3.5–5.1)
Sodium: 138 mEq/L (ref 135–145)
Total Bilirubin: 0.6 mg/dL (ref 0.2–1.2)
Total Protein: 6.5 g/dL (ref 6.0–8.3)

## 2022-03-15 LAB — CBC WITH DIFFERENTIAL/PLATELET
Basophils Absolute: 0 10*3/uL (ref 0.0–0.1)
Basophils Relative: 1.3 % (ref 0.0–3.0)
Eosinophils Absolute: 0.2 10*3/uL (ref 0.0–0.7)
Eosinophils Relative: 4 % (ref 0.0–5.0)
HCT: 38.3 % (ref 36.0–46.0)
Hemoglobin: 12.7 g/dL (ref 12.0–15.0)
Lymphocytes Relative: 35.7 % (ref 12.0–46.0)
Lymphs Abs: 1.4 10*3/uL (ref 0.7–4.0)
MCHC: 33.2 g/dL (ref 30.0–36.0)
MCV: 96.5 fl (ref 78.0–100.0)
Monocytes Absolute: 0.5 10*3/uL (ref 0.1–1.0)
Monocytes Relative: 12.6 % — ABNORMAL HIGH (ref 3.0–12.0)
Neutro Abs: 1.8 10*3/uL (ref 1.4–7.7)
Neutrophils Relative %: 46.4 % (ref 43.0–77.0)
Platelets: 226 10*3/uL (ref 150.0–400.0)
RBC: 3.97 Mil/uL (ref 3.87–5.11)
RDW: 14.4 % (ref 11.5–15.5)
WBC: 3.9 10*3/uL — ABNORMAL LOW (ref 4.0–10.5)

## 2022-03-15 LAB — VITAMIN D 25 HYDROXY (VIT D DEFICIENCY, FRACTURES): VITD: 28.74 ng/mL — ABNORMAL LOW (ref 30.00–100.00)

## 2022-03-15 LAB — TSH: TSH: 3.5 u[IU]/mL (ref 0.35–5.50)

## 2022-03-15 NOTE — Patient Instructions (Signed)

## 2022-03-15 NOTE — Progress Notes (Signed)
?Subjective  ?Chief Complaint  ?Patient presents with  ? Annual Exam  ?  Pt is here for Annual Exam and she is currently fasting  ? ? ?HPI: Haley Huerta is a 71 y.o. female who presents to [facility] today for a Female Wellness Visit. She also has the concerns and/or needs as listed above in the chief complaint. These will be addressed in addition to the Health Maintenance Visit.  ? ?Wellness Visit: annual visit with health maintenance review and exam without Pap ? ?Health maintenance is current.  Immunizations are up-to-date. ?Chronic disease f/u and/or acute problem visit: (deemed necessary to be done in addition to the wellness visit): ?Chronic back pain due to DJD of cervical spine on chronic Ultram.  Remains well controlled.  No new symptoms.  No radicular symptoms. ?Chronic insomnia: Uses nightly Xanax.  No adverse effects. ?Osteoporosis: Vitamin D and weightbearing exercise is her preference.  Will be due for bone density next year.  If worsening can again discuss treatment, recommendation would be for Evista.  See problem list ?History of colon cancer: Colon cancer screening/surveillance due in January 2024.  Reviewed recent gastroenterology consult. ?Vitamin D deficiency with osteoporosis: Has been well controlled on supplements.  Due for recheck today. ? ?Assessment  ?1. Spondylosis of cervical region without myelopathy or radiculopathy   ?2. Insomnia secondary to chronic pain   ?3. Chronic use of benzodiazepine for therapeutic purpose   ?4. History of colon cancer- cecal adenocarcinoma   ?5. Age-related osteoporosis without current pathological fracture   ?6. S/P left hemicolectomy   ?7. Vitamin D deficiency   ?8. Seasonal allergic rhinitis due to pollen   ? ?  ?Plan  ?Female Wellness Visit: ?Age appropriate Health Maintenance and Prevention measures were discussed with patient. Included topics are cancer screening recommendations, ways to keep healthy (see AVS)  including dietary and exercise recommendations, regular eye and dental care, use of seat belts, and avoidance of moderate alcohol use and tobacco use.  DEXA next year, colonoscopy January 2024 ?BMI: discussed patient's BMI and encouraged positive lifestyle modifications to help get to or maintain a target BMI. ?HM needs and immunizations were addressed and ordered. See below for orders. See HM and immunization section for updates. ?Routine labs and screening tests ordered including cmp, cbc and lipids where appropriate. ?Discussed recommendations regarding Vit D and calcium supplementation (see AVS) ? ?Chronic disease management visit and/or acute problem visit: ?Chronic neck pain and chronic insomnia are well controlled.  Continue Ultram 50 and Xanax nightly.  Refills are current ?Osteoporosis: Patient continues to defer treatment at this time.  We will recheck bone density next year ?Monitoring vitamin D levels. ?Allergic rhinitis: Currently active.  Add Zyrtec to Flonase.  Education given. ? ?Follow up: Return in about 1 year (around 03/16/2023) for complete physical.  ?Orders Placed This Encounter  ?Procedures  ? CBC with Differential/Platelet  ? Comprehensive metabolic panel  ? TSH  ? VITAMIN D 25 Hydroxy (Vit-D Deficiency, Fractures)  ? ?No orders of the defined types were placed in this encounter. ? ?  ? ?Body mass index is 20.77 kg/m?. ?Wt Readings from Last 3 Encounters:  ?03/15/22 121 lb (54.9 kg)  ?10/10/21 116 lb 9.6 oz (52.9 kg)  ?09/02/21 121 lb 6.4 oz (55.1 kg)  ? ? ? ?Patient Active Problem List  ? Diagnosis Date Noted  ? Insomnia secondary to chronic pain 08/23/2018  ?  Priority: High  ? History of colon cancer- cecal  adenocarcinoma 08/23/2018  ?  Priority: High  ? S/P left hemicolectomy 08/23/2018  ?  Priority: High  ?  Chemo and radiation and hemicolectomy 2002 ? ?  ? DJD (degenerative joint disease) of cervical spine 08/23/2018  ?  Priority: High  ?  Chronic pain on tramadol ? ?  ? Osteoporosis  08/23/2018  ?  Priority: High  ?  Dexa 08/2017 lowest T=-2.8; vit d and calcium; rec tx, to start prolia 09/2018- had allergic reaction to prolia. ?Dexa 2021 ordered: rec evista. ?Dexa 2022: T = -2.9 lumbar. Pt failed biphosphanates and prolia. Declines evista 08/2021: prefers to reassess in 2 years. Declines endocrine consult for further discussion at this time.  ? ?  ? Chronic use of benzodiazepine for therapeutic purpose 08/23/2018  ?  Priority: High  ?  Uses for neck pain, muscle tension (failed tricyclics, mm relaxers) and neck pain. Has been ativan since 2003 since neck surgery. ? ?  ? History of ulcerative colitis 08/23/2018  ?  Priority: Medium   ? Macular degeneration 08/23/2018  ?  Priority: Low  ? Seasonal allergic rhinitis due to pollen 03/15/2022  ? ?Health Maintenance  ?Topic Date Due  ? COVID-19 Vaccine (6 - Booster for Pfizer series) 10/20/2021  ? MAMMOGRAM  06/09/2022  ? INFLUENZA VACCINE  06/27/2022  ? COLONOSCOPY (Pts 45-23yr Insurance coverage will need to be confirmed)  12/07/2022  ? DEXA SCAN  06/10/2023  ? TETANUS/TDAP  08/07/2024  ? Pneumonia Vaccine 73 Years old  Completed  ? Hepatitis C Screening  Completed  ? Zoster Vaccines- Shingrix  Completed  ? HPV VACCINES  Aged Out  ? ?Immunization History  ?Administered Date(s) Administered  ? Fluad Quad(high Dose 65+) 07/29/2019, 08/25/2021  ? Influenza, High Dose Seasonal PF 08/23/2018, 08/25/2021  ? Influenza-Unspecified 08/30/2020  ? Moderna Covid-19 Vaccine Bivalent Booster 161yr& up 08/25/2021  ? Moderna Sars-Covid-2 Vaccination 08/25/2021  ? PFIZER Comirnaty(Gray Top)Covid-19 Tri-Sucrose Vaccine 03/21/2021  ? PFIZER(Purple Top)SARS-COV-2 Vaccination 01/18/2020, 02/11/2020, 08/30/2020  ? Pneumococcal Conjugate-13 12/10/2019  ? Pneumococcal Polysaccharide-23 08/23/2018  ? Tdap 08/07/2014  ? Zoster Recombinat (Shingrix) 09/26/2018, 01/13/2019  ? Zoster, Live 08/06/2013  ? ?We updated and reviewed the patient's past history in detail and it is  documented below. ?Allergies: ?Patient is allergic to prolia [denosumab] and phenergan [promethazine hcl]. ?Past Medical History ?Patient  has a past medical history of Allergy, Anxiety, Arthritis, Chronic use of benzodiazepine for therapeutic purpose (08/23/2018), Colon cancer ([place](2001), DJD (degenerative joint disease) of cervical spine (08/23/2018), GERD (gastroesophageal reflux disease), History of ulcerative colitis (08/23/2018), Macular degeneration, and Osteoporosis (08/23/2018). ?Past Surgical History ?Patient  has a past surgical history that includes Abdominal hysterectomy; Hemicolectomy (2001); Cervical discectomy; Breast surgery; Appendectomy; and Cataract extraction (Bilateral). ?Family History: ?Patient family history includes Arthritis in her mother; Congestive Heart Failure in her father; Depression in her son; Hypertension in her father; Prostate cancer in her father; Restless legs syndrome in her brother and father; Stroke in her mother. ?Social History:  ?Patient  reports that she has never smoked. She has never used smokeless tobacco. She reports current alcohol use. She reports that she does not use drugs. ? ?Review of Systems: ?Constitutional: negative for fever or malaise ?Ophthalmic: negative for photophobia, double vision or loss of vision ?Cardiovascular: negative for chest pain, dyspnea on exertion, or new LE swelling ?Respiratory: negative for SOB or persistent cough ?Gastrointestinal: negative for abdominal pain, change in bowel habits or melena ?Genitourinary: negative for dysuria or gross hematuria, no abnormal  uterine bleeding or disharge ?Musculoskeletal: negative for new gait disturbance or muscular weakness ?Integumentary: negative for new or persistent rashes, no breast lumps ?Neurological: negative for TIA or stroke symptoms ?Psychiatric: negative for SI or delusions ?Allergic/Immunologic: negative for hives ? ?Patient Care Team  ?  Relationship Specialty Notifications Start  End  ?Leamon Arnt, MD PCP - General Family Medicine  08/23/18   ?Sherlynn Stalls, MD Consulting Physician Ophthalmology  08/23/18   ?Daryel November, MD Consulting Physician Gastroenterology  03/15/22   ?

## 2022-03-16 ENCOUNTER — Encounter: Payer: Self-pay | Admitting: Family Medicine

## 2022-04-14 DIAGNOSIS — H353221 Exudative age-related macular degeneration, left eye, with active choroidal neovascularization: Secondary | ICD-10-CM | POA: Diagnosis not present

## 2022-04-14 DIAGNOSIS — H353111 Nonexudative age-related macular degeneration, right eye, early dry stage: Secondary | ICD-10-CM | POA: Diagnosis not present

## 2022-04-14 DIAGNOSIS — H43813 Vitreous degeneration, bilateral: Secondary | ICD-10-CM | POA: Diagnosis not present

## 2022-04-21 ENCOUNTER — Ambulatory Visit (INDEPENDENT_AMBULATORY_CARE_PROVIDER_SITE_OTHER): Payer: Medicare Other

## 2022-04-21 DIAGNOSIS — Z0000 Encounter for general adult medical examination without abnormal findings: Secondary | ICD-10-CM | POA: Diagnosis not present

## 2022-04-21 NOTE — Patient Instructions (Addendum)
Ms. Livingston , Thank you for taking time to come for your Medicare Wellness Visit. I appreciate your ongoing commitment to your health goals. Please review the following plan we discussed and let me know if I can assist you in the future.   Screening recommendations/referrals: Colonoscopy: Done 12/07/17 repeat every 5 years Mammogram: Done 06/09/21 repeat every year  Bone Density: Done 06/09/21 repeat every 2 years  Recommended yearly ophthalmology/optometry visit for glaucoma screening and checkup Recommended yearly dental visit for hygiene and checkup  Vaccinations: Influenza vaccine: Done 08/25/21 repeat every year  Pneumococcal vaccine: Up to date Tdap vaccine: Done 08/07/14 repeat every  10 years  Shingles vaccine: completed 09/26/18, 01/13/19 Covid-19:Completed 2/21, 3/17, 08/30/20,03/21/21,  08/16/21  Advanced directives: Advance directive discussed with you today. Even though you declined this today please call our office should you change your mind and we can give you the proper paperwork for you to fill out.   Conditions/risks identified: none at this time   Next appointment: Follow up in one year for your annual wellness visit    Preventive Care 65 Years and Older, Female Preventive care refers to lifestyle choices and visits with your health care provider that can promote health and wellness. What does preventive care include? A yearly physical exam. This is also called an annual well check. Dental exams once or twice a year. Routine eye exams. Ask your health care provider how often you should have your eyes checked. Personal lifestyle choices, including: Daily care of your teeth and gums. Regular physical activity. Eating a healthy diet. Avoiding tobacco and drug use. Limiting alcohol use. Practicing safe sex. Taking low-dose aspirin every day. Taking vitamin and mineral supplements as recommended by your health care provider. What happens during an annual well check? The  services and screenings done by your health care provider during your annual well check will depend on your age, overall health, lifestyle risk factors, and family history of disease. Counseling  Your health care provider may ask you questions about your: Alcohol use. Tobacco use. Drug use. Emotional well-being. Home and relationship well-being. Sexual activity. Eating habits. History of falls. Memory and ability to understand (cognition). Work and work Statistician. Reproductive health. Screening  You may have the following tests or measurements: Height, weight, and BMI. Blood pressure. Lipid and cholesterol levels. These may be checked every 5 years, or more frequently if you are over 55 years old. Skin check. Lung cancer screening. You may have this screening every year starting at age 77 if you have a 30-pack-year history of smoking and currently smoke or have quit within the past 15 years. Fecal occult blood test (FOBT) of the stool. You may have this test every year starting at age 46. Flexible sigmoidoscopy or colonoscopy. You may have a sigmoidoscopy every 5 years or a colonoscopy every 10 years starting at age 58. Hepatitis C blood test. Hepatitis B blood test. Sexually transmitted disease (STD) testing. Diabetes screening. This is done by checking your blood sugar (glucose) after you have not eaten for a while (fasting). You may have this done every 1-3 years. Bone density scan. This is done to screen for osteoporosis. You may have this done starting at age 14. Mammogram. This may be done every 1-2 years. Talk to your health care provider about how often you should have regular mammograms. Talk with your health care provider about your test results, treatment options, and if necessary, the need for more tests. Vaccines  Your health care provider may  recommend certain vaccines, such as: Influenza vaccine. This is recommended every year. Tetanus, diphtheria, and acellular  pertussis (Tdap, Td) vaccine. You may need a Td booster every 10 years. Zoster vaccine. You may need this after age 7. Pneumococcal 13-valent conjugate (PCV13) vaccine. One dose is recommended after age 89. Pneumococcal polysaccharide (PPSV23) vaccine. One dose is recommended after age 39. Talk to your health care provider about which screenings and vaccines you need and how often you need them. This information is not intended to replace advice given to you by your health care provider. Make sure you discuss any questions you have with your health care provider. Document Released: 12/10/2015 Document Revised: 08/02/2016 Document Reviewed: 09/14/2015 Elsevier Interactive Patient Education  2017 [employer] Prevention in the Home Falls can cause injuries. They can happen to people of all ages. There are many things you can do to make your home safe and to help prevent falls. What can I do on the outside of my home? Regularly fix the edges of walkways and driveways and fix any cracks. Remove anything that might make you trip as you walk through a door, such as a raised step or threshold. Trim any bushes or trees on the path to your home. Use bright outdoor lighting. Clear any walking paths of anything that might make someone trip, such as rocks or tools. Regularly check to see if handrails are loose or broken. Make sure that both sides of any steps have handrails. Any raised decks and porches should have guardrails on the edges. Have any leaves, snow, or ice cleared regularly. Use sand or salt on walking paths during winter. Clean up any spills in your garage right away. This includes oil or grease spills. What can I do in the bathroom? Use night lights. Install grab bars by the toilet and in the tub and shower. Do not use towel bars as grab bars. Use non-skid mats or decals in the tub or shower. If you need to sit down in the shower, use a plastic, non-slip stool. Keep the floor  dry. Clean up any water that spills on the floor as soon as it happens. Remove soap buildup in the tub or shower regularly. Attach bath mats securely with double-sided non-slip rug tape. Do not have throw rugs and other things on the floor that can make you trip. What can I do in the bedroom? Use night lights. Make sure that you have a light by your bed that is easy to reach. Do not use any sheets or blankets that are too big for your bed. They should not hang down onto the floor. Have a firm chair that has side arms. You can use this for support while you get dressed. Do not have throw rugs and other things on the floor that can make you trip. What can I do in the kitchen? Clean up any spills right away. Avoid walking on wet floors. Keep items that you use a lot in easy-to-reach places. If you need to reach something above you, use a strong step stool that has a grab bar. Keep electrical cords out of the way. Do not use floor polish or wax that makes floors slippery. If you must use wax, use non-skid floor wax. Do not have throw rugs and other things on the floor that can make you trip. What can I do with my stairs? Do not leave any items on the stairs. Make sure that there are handrails on both sides of  the stairs and use them. Fix handrails that are broken or loose. Make sure that handrails are as long as the stairways. Check any carpeting to make sure that it is firmly attached to the stairs. Fix any carpet that is loose or worn. Avoid having throw rugs at the top or bottom of the stairs. If you do have throw rugs, attach them to the floor with carpet tape. Make sure that you have a light switch at the top of the stairs and the bottom of the stairs. If you do not have them, ask someone to add them for you. What else can I do to help prevent falls? Wear shoes that: Do not have high heels. Have rubber bottoms. Are comfortable and fit you well. Are closed at the toe. Do not wear  sandals. If you use a stepladder: Make sure that it is fully opened. Do not climb a closed stepladder. Make sure that both sides of the stepladder are locked into place. Ask someone to hold it for you, if possible. Clearly mark and make sure that you can see: Any grab bars or handrails. First and last steps. Where the edge of each step is. Use tools that help you move around (mobility aids) if they are needed. These include: Canes. Walkers. Scooters. Crutches. Turn on the lights when you go into a dark area. Replace any light bulbs as soon as they burn out. Set up your furniture so you have a clear path. Avoid moving your furniture around. If any of your floors are uneven, fix them. If there are any pets around you, be aware of where they are. Review your medicines with your doctor. Some medicines can make you feel dizzy. This can increase your chance of falling. Ask your doctor what other things that you can do to help prevent falls. This information is not intended to replace advice given to you by your health care provider. Make sure you discuss any questions you have with your health care provider. Document Released: 09/09/2009 Document Revised: 04/20/2016 Document Reviewed: 12/18/2014 Elsevier Interactive Patient Education  2017 [employer].

## 2022-04-21 NOTE — Progress Notes (Signed)
Virtual Visit via Telephone Note  I connected with  Haley Huerta on 04/21/22 at 11:00 AM EDT by telephone and verified that I am speaking with the correct person using two identifiers.  Medicare Annual Wellness visit completed telephonically due to Covid-19 pandemic.   Persons participating in this call: This Health Coach and this patient.   Location: Patient: home Provider: office   I discussed the limitations, risks, security and privacy concerns of performing an evaluation and management service by telephone and the availability of in person appointments. The patient expressed understanding and agreed to proceed.  Unable to perform video visit due to video visit attempted and failed and/or patient does not have video capability.   Some vital signs may be absent or patient reported.   Willette Brace, LPN   Subjective:   Haley Huerta is a 71 y.o. female who presents for Medicare Annual (Subsequent) preventive examination.  Review of Systems     Cardiac Risk Factors include: advanced age (>39mn, >>3women)     Objective:    There were no vitals filed for this visit. There is no height or weight on file to calculate BMI.     04/21/2022   11:01 AM 04/15/2021   11:11 AM 03/12/2020   12:37 PM 10/16/2018   10:26 AM  Advanced Directives  Does Patient Have a Medical Advance Directive? No Yes No No  Type of Advance Directive  [place]?  No - copy requested    Would patient like information on creating a medical advance directive? Yes (MAU/Ambulatory/Procedural Areas - Information given)  Yes (MAU/Ambulatory/Procedural Areas - Information given) Yes (MAU/Ambulatory/Procedural Areas - Information given)    Current Medications (verified) Outpatient Encounter Medications as of 04/21/2022  Medication Sig   Cetirizine HCl (ZYRTEC ALLERGY PO) Take by mouth.   fluticasone (FLONASE) 50 MCG/ACT nasal  spray Place into both nostrils daily.   LORazepam (ATIVAN) 1 MG tablet Take 1 tablet (1 mg total) by mouth 2 (two) times daily as needed for anxiety.   Multiple Vitamins-Minerals (PRESERVISION AREDS PO) Take by mouth.   NON FORMULARY 1 % every morning.   NON FORMULARY daily.   traMADol (ULTRAM) 50 MG tablet Take 2 tablets (100 mg total) by mouth 2 (two) times daily.   [DISCONTINUED] diphenhydrAMINE (BENADRYL) 25 MG tablet Take 25 mg by mouth every 6 (six) hours as needed.   No facility-administered encounter medications on file as of 04/21/2022.    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    Past Surgical History:  Procedure Laterality Date   ABDOMINAL HYSTERECTOMY     APPENDECTOMY     BREAST SURGERY     Implants   CATARACT EXTRACTION Bilateral    CERVICAL DISCECTOMY     Fusion  HEMICOLECTOMY  2001   R- Cancer    Family History  Problem Relation Age of Onset   Arthritis Mother    Stroke Mother    Prostate cancer Father    Congestive Heart Failure Father    Hypertension Father    Restless legs syndrome Father    Depression Son    Restless legs syndrome Brother    Social History   Socioeconomic History   Marital status: Divorced    Spouse name: Not on file   Number of children: 1   Years of education: Not on file   Highest education level: Not on file  Occupational  History   Occupation: retired, Corporate treasurer HMO auditor etc  Tobacco Use   Smoking status: Never   Smokeless tobacco: Never  Vaping Use   Vaping Use: Never used  Substance and Sexual Activity   Alcohol use: Yes    Comment: rarely    Drug use: Never   Sexual activity: Not Currently    Birth control/protection: Post-menopausal  Other Topics Concern   Not on file  Social History Narrative   Previously lived in [place] moved to area to be closer to son    Social Determinants of Health   Financial Resource Strain: Low Risk    Difficulty of Paying Living Expenses: Not hard at all  Food Insecurity: No Food Insecurity   Worried About Charity fundraiser in the Last Year: Never true   Arboriculturist in the Last Year: Never true  Transportation Needs: No Transportation Needs   Lack of Transportation (Medical): No   Lack of Transportation (Non-Medical): No  Physical Activity: Inactive   Days of Exercise per Week: 0 days   Minutes of Exercise per Session: 0 min  Stress: Stress Concern Present   Feeling of Stress : To some extent  Social Connections: Unknown   Frequency of Communication with Friends and Family: More than three times a week   Frequency of Social Gatherings with Friends and Family: More than three times a week   Attends Religious Services: Not on Electrical engineer or Organizations: No   Attends Archivist Meetings: Never   Marital Status: Divorced    Tobacco Counseling Counseling given: Not Answered   Clinical Intake:  Pre-visit preparation completed: Yes  Pain : No/denies pain     BMI - recorded: 20.77 Nutritional Status: BMI of 19-24  Normal Nutritional Risks: None Diabetes: No  How often do you need to have someone help you when you read instructions, pamphlets, or other written materials from your doctor or pharmacy?: 1 - Never  Diabetic?no  Interpreter Needed?: No  Information entered by :: Charlott Rakes,  LPN   Activities of Daily Living    04/21/2022   11:02 AM  In your present state of health, do you have any difficulty performing the following activities:  Hearing? 0  Vision? 0  Difficulty concentrating or making decisions? 0  Walking or climbing stairs? 0  Dressing or bathing? 0  Doing errands, shopping? 0  Preparing Food and eating ? N  Using the Toilet? N  In the past six months, have you accidently leaked urine? N  Do you have problems with loss of bowel control? N  Managing your Medications? N  Managing your Finances? N  Housekeeping or managing your Housekeeping? N    Patient Care Team: Leamon Arnt, MD as PCP - General (Family Medicine) Sherlynn Stalls, MD as Consulting Physician (  Ophthalmology) Daryel November, MD as Consulting Physician (Gastroenterology)  Indicate any recent Medical Services you may have received from other than [facility] providers in the past year (date may be approximate).     Assessment:   This is a routine wellness examination for [place].  Hearing/Vision screen Hearing Screening - Comments:: Pt denies any hearing issues  Vision Screening - Comments:: Pt follows up with for annual eye exams   Dietary issues and exercise activities discussed: Current Exercise Habits: The patient does not participate in regular exercise at present   Goals Addressed             This Visit's Progress    Patient Stated       None at this time        Depression Screen    04/21/2022   10:59 AM 04/15/2021   11:09 AM 03/03/2021    9:56 AM 03/12/2020    1:27 PM 04/16/2019   10:03 AM 10/16/2018   10:27 AM 08/23/2018    9:16 AM  PHQ 2/9 Scores  PHQ - 2 Score 0 0 0 0 0 0 0    Fall Risk    04/21/2022   11:02 AM 03/15/2022   10:46 AM 04/15/2021   11:13 AM 03/03/2021    9:56 AM 06/11/2020    3:52 PM  Fall Risk   Falls in the past year? 0 0 0 0 0  Number falls in past yr: 0 0 0 0 0  Injury with Fall? 0 0 0 0 0  Risk for fall due to : Impaired vision   Impaired vision;Impaired balance/gait    Risk for fall due to: Comment   careful related to eyes going down steps    Follow up Falls prevention discussed Falls evaluation completed Falls prevention discussed      FALL RISK PREVENTION PERTAINING TO THE HOME:  Any stairs in or around the home? No  If so, are there any without handrails? No  Home free of loose throw rugs in walkways, pet beds, electrical cords, etc? Yes  Adequate lighting in your home to reduce risk of falls? Yes   ASSISTIVE DEVICES UTILIZED TO PREVENT FALLS:  Life alert? No  Use of a cane, walker or w/c? No  Grab bars in the bathroom? Yes  Shower chair or bench in shower? No  Elevated toilet seat or a handicapped toilet? No   TIMED UP AND GO:  Was the test performed? No .   Cognitive Function:    10/16/2018   10:30 AM  MMSE - Mini Mental State Exam  Orientation to time 4  Orientation to Place 5  Registration 3  Attention/ Calculation 5  Recall 2  Language- name 2 objects 2  Language- repeat 1  Language- follow 3 step command 3  Language- read & follow direction 1  Write a sentence 1  Copy design 1  Total score 28        04/21/2022   11:03 AM 04/15/2021   11:16 AM 03/12/2020   12:38 PM  6CIT Screen  What Year? 0 points 0 points 0 points  What month? 0 points 0 points 0 points  What time? 0 points  0 points  Count back from 20 0 points 0 points 0 points  Months in reverse 0 points 0 points 0 points  Repeat phrase 0 points 0 points 0 points  Total Score 0 points  0 points    Immunizations Immunization History  Administered Date(s) Administered  Fluad Quad(high Dose 65+) 07/29/2019, 08/25/2021   Influenza, High Dose Seasonal PF 08/23/2018, 08/25/2021   Influenza-Unspecified 08/30/2020   PFIZER Comirnaty(Gray Top)Covid-19 Tri-Sucrose Vaccine 03/21/2021   PFIZER(Purple Top)SARS-COV-2 Vaccination 01/18/2020, 02/11/2020, 08/30/2020, 08/16/2021   Pneumococcal Conjugate-13 12/10/2019    Pneumococcal Polysaccharide-23 08/23/2018   Tdap 08/07/2014   Zoster Recombinat (Shingrix) 09/26/2018, 01/13/2019   Zoster, Live 08/06/2013    TDAP status: Up to date  Flu Vaccine status: Up to date  Pneumococcal vaccine status: Up to date  Covid-19 vaccine status: Completed vaccines  Qualifies for Shingles Vaccine? Yes   Zostavax completed Yes   Shingrix Completed?: Yes  Screening Tests Health Maintenance  Topic Date Due   COVID-19 Vaccine (6 - Booster for Pfizer series) 10/11/2021   MAMMOGRAM  06/09/2022   INFLUENZA VACCINE  06/27/2022   COLONOSCOPY (Pts 45-4yr Insurance coverage will need to be confirmed)  12/07/2022   DEXA SCAN  06/10/2023   TETANUS/TDAP  08/07/2024   Pneumonia Vaccine 73 Years old  Completed   Hepatitis C Screening  Completed   Zoster Vaccines- Shingrix  Completed   HPV VACCINES  Aged Out    Health Maintenance  Health Maintenance Due  Topic Date Due   COVID-19 Vaccine (6 - Booster for [place]) 10/11/2021    Colorectal cancer screening: Type of screening: Colonoscopy. Completed 12/07/17. Repeat every 5 years  Mammogram status: Completed 06/09/21. Repeat every year  Bone Density status: Completed 06/09/21. Results reflect: Bone density results: OSTEOPOROSIS. Repeat every 2 years.   Additional Screening:  Hepatitis C Screening:  Completed 10/16/18  Vision Screening: Recommended annual ophthalmology exams for early detection of glaucoma and other disorders of the eye. Is the patient up to date with their annual eye exam?  Yes  Who is the provider or what is the name of the office in which the patient attends annual eye exams? Will follow up with New provider  If pt is not established with a provider, would they like to be referred to a provider to establish care? No .   Dental Screening: Recommended annual dental exams for proper oral hygiene  Community Resource Referral / Chronic Care Management: CRR required this visit?  No   CCM  required this visit?  No      Plan:     I have personally reviewed and noted the following in the patient's chart:   Medical and social history Use of alcohol, tobacco or illicit drugs  Current medications and supplements including opioid prescriptions.  Functional ability and status Nutritional status Physical activity Advanced directives List of other physicians Hospitalizations, surgeries, and ER visits in previous 12 months Vitals Screenings to include cognitive, depression, and falls Referrals and appointments  In addition, I have reviewed and discussed with patient certain preventive protocols, quality metrics, and best practice recommendations. A written personalized care plan for preventive services as well as general preventive health recommendations were provided to patient.     TWillette Brace LPN   [id number]/[id number]  Nurse Notes: none

## 2022-05-22 ENCOUNTER — Encounter: Payer: Self-pay | Admitting: Family Medicine

## 2022-05-24 MED ORDER — TRAMADOL HCL 50 MG PO TABS: 100.0000 mg | ORAL_TABLET | Freq: Two times a day (BID) | ORAL | 5 refills | Status: DC

## 2022-05-24 MED ORDER — LORAZEPAM 1 MG PO TABS: 1.0000 mg | ORAL_TABLET | Freq: Two times a day (BID) | ORAL | 5 refills | Status: DC | PRN

## 2022-05-26 DIAGNOSIS — H43813 Vitreous degeneration, bilateral: Secondary | ICD-10-CM | POA: Diagnosis not present

## 2022-05-26 DIAGNOSIS — H353111 Nonexudative age-related macular degeneration, right eye, early dry stage: Secondary | ICD-10-CM | POA: Diagnosis not present

## 2022-05-26 DIAGNOSIS — H353221 Exudative age-related macular degeneration, left eye, with active choroidal neovascularization: Secondary | ICD-10-CM | POA: Diagnosis not present

## 2022-07-25 DIAGNOSIS — H353111 Nonexudative age-related macular degeneration, right eye, early dry stage: Secondary | ICD-10-CM | POA: Diagnosis not present

## 2022-07-25 DIAGNOSIS — H26493 Other secondary cataract, bilateral: Secondary | ICD-10-CM | POA: Diagnosis not present

## 2022-07-25 DIAGNOSIS — H43813 Vitreous degeneration, bilateral: Secondary | ICD-10-CM | POA: Diagnosis not present

## 2022-07-25 DIAGNOSIS — H353221 Exudative age-related macular degeneration, left eye, with active choroidal neovascularization: Secondary | ICD-10-CM | POA: Diagnosis not present

## 2022-08-21 ENCOUNTER — Encounter: Payer: Self-pay | Admitting: *Deleted

## 2022-09-04 DIAGNOSIS — Z23 Encounter for immunization: Secondary | ICD-10-CM | POA: Diagnosis not present

## 2022-09-27 DIAGNOSIS — H353111 Nonexudative age-related macular degeneration, right eye, early dry stage: Secondary | ICD-10-CM | POA: Diagnosis not present

## 2022-09-27 DIAGNOSIS — H26493 Other secondary cataract, bilateral: Secondary | ICD-10-CM | POA: Diagnosis not present

## 2022-09-27 DIAGNOSIS — H353221 Exudative age-related macular degeneration, left eye, with active choroidal neovascularization: Secondary | ICD-10-CM | POA: Diagnosis not present

## 2022-09-27 DIAGNOSIS — H43813 Vitreous degeneration, bilateral: Secondary | ICD-10-CM | POA: Diagnosis not present

## 2022-12-01 ENCOUNTER — Encounter: Payer: Self-pay | Admitting: Family Medicine

## 2022-12-01 MED ORDER — TRAMADOL HCL 50 MG PO TABS: 100.0000 mg | ORAL_TABLET | Freq: Two times a day (BID) | ORAL | 5 refills | Status: DC

## 2022-12-01 MED ORDER — LORAZEPAM 1 MG PO TABS: 1.0000 mg | ORAL_TABLET | Freq: Two times a day (BID) | ORAL | 5 refills | Status: DC | PRN

## 2022-12-06 DIAGNOSIS — H43813 Vitreous degeneration, bilateral: Secondary | ICD-10-CM | POA: Diagnosis not present

## 2022-12-06 DIAGNOSIS — H353111 Nonexudative age-related macular degeneration, right eye, early dry stage: Secondary | ICD-10-CM | POA: Diagnosis not present

## 2022-12-06 DIAGNOSIS — H353221 Exudative age-related macular degeneration, left eye, with active choroidal neovascularization: Secondary | ICD-10-CM | POA: Diagnosis not present

## 2023-01-24 ENCOUNTER — Encounter: Payer: Self-pay | Admitting: Gastroenterology

## 2023-02-21 DIAGNOSIS — H353111 Nonexudative age-related macular degeneration, right eye, early dry stage: Secondary | ICD-10-CM | POA: Diagnosis not present

## 2023-02-21 DIAGNOSIS — H43813 Vitreous degeneration, bilateral: Secondary | ICD-10-CM | POA: Diagnosis not present

## 2023-02-21 DIAGNOSIS — H353221 Exudative age-related macular degeneration, left eye, with active choroidal neovascularization: Secondary | ICD-10-CM | POA: Diagnosis not present

## 2023-03-14 ENCOUNTER — Encounter: Payer: Self-pay | Admitting: Family Medicine

## 2023-03-15 ENCOUNTER — Ambulatory Visit (INDEPENDENT_AMBULATORY_CARE_PROVIDER_SITE_OTHER): Payer: Medicare Other | Admitting: Family Medicine

## 2023-03-15 VITALS — BP 110/80 | HR 69 | Temp 98.3°F | Ht 64.0 in | Wt 122.2 lb

## 2023-03-15 DIAGNOSIS — M810 Age-related osteoporosis without current pathological fracture: Secondary | ICD-10-CM

## 2023-03-15 DIAGNOSIS — Z9049 Acquired absence of other specified parts of digestive tract: Secondary | ICD-10-CM

## 2023-03-15 DIAGNOSIS — H1013 Acute atopic conjunctivitis, bilateral: Secondary | ICD-10-CM

## 2023-03-15 DIAGNOSIS — Z1231 Encounter for screening mammogram for malignant neoplasm of breast: Secondary | ICD-10-CM

## 2023-03-15 DIAGNOSIS — Z780 Asymptomatic menopausal state: Secondary | ICD-10-CM | POA: Diagnosis not present

## 2023-03-15 DIAGNOSIS — Z85038 Personal history of other malignant neoplasm of large intestine: Secondary | ICD-10-CM

## 2023-03-15 DIAGNOSIS — J301 Allergic rhinitis due to pollen: Secondary | ICD-10-CM

## 2023-03-15 MED ORDER — METHYLPREDNISOLONE ACETATE 80 MG/ML IJ SUSP: 80.0000 mg | Freq: Once | INTRAMUSCULAR | Status: DC

## 2023-03-15 MED ORDER — METHYLPREDNISOLONE ACETATE 80 MG/ML IJ SUSP
80.0000 mg | Freq: Once | INTRAMUSCULAR | Status: DC
Start: 2023-03-15 — End: 2023-03-15
  Administered 2023-03-15: 80 mg via INTRAMUSCULAR

## 2023-03-15 MED ORDER — ERYTHROMYCIN 5 MG/GM OP OINT
1.0000 | TOPICAL_OINTMENT | Freq: Three times a day (TID) | OPHTHALMIC | 0 refills | Status: DC
Start: 2023-03-15 — End: 2023-04-30

## 2023-03-15 NOTE — Patient Instructions (Addendum)
Please follow up as scheduled for your next visit with me: 03/19/2023   If you have any questions or concerns, please don't hesitate to send me a message via MyChart or call the office at [phone]. Thank you for visiting with [place] today! It's our pleasure caring for you.   Please call the office checked below to schedule your appointment for your mammogram and/or bone density screen (the checked studies were ordered):   Mammogram    Bone Density    [facility]     [address] [place], [place]        [phone]           [facility]  [address] [place], [place]  [phone]

## 2023-03-15 NOTE — Progress Notes (Signed)
Some  Subjective  CC:  Chief Complaint  Patient presents with   Allergies    Eyes swelling and itchen.    HPI: Haley Huerta is a 72 y.o. female who presents to the office today to address the problems listed above in the chief complaint. 72 year old with chronic seasonal allergies, status post immunotherapy by an allergist in the past presents due to worsening eye symptoms.  She takes Flonase and Allegra chronically for nasal symptoms.  Has been fairly well-controlled except for the last month when pollen season erupted.  Now with several week history of red, draining, itching and puffy eyes.  Has used Pataday over-the-counter for the last several weeks without any improvement in symptoms, actually thinks it makes the itching worse.  No purulent drainage, no eyelash matting but at times she will have crusting when she awakens.  No vision changes or eye pain.  No photophobia.  No cough.  Clear rhinorrhea persist.  In the past, she has had infrequent systemic steroid injections which have helped immensely.  This was done by her allergist.  She tolerated steroids well. Osteoporosis follow-up: Reviewed most recent bone density, 2022 showing persistent osteoporosis.  We reviewed our conversation regarding treatment options.  Patient deferred treatment at that time and now ready for reassessment.  No fractures in the interim. Health maintenance: Scheduled for physical soon.  Will be due for mammogram in July and colonoscopy which she is scheduling with GI now.  Has had history of colon cancer.  Assessment  1. Allergic conjunctivitis of both eyes   2. Seasonal allergic rhinitis due to pollen   3. Age-related osteoporosis without current pathological fracture   4. Asymptomatic menopausal state   5. Screening mammogram for breast cancer   6. History of colon cancer- cecal adenocarcinoma   7. S/P left hemicolectomy      Plan  Allergic conjunctivitis and rhinitis, seasonal: Very active.  Not  responding to conservative treatment measures.  Depo-Medrol 80 mg IM given today.  Education on risk versus benefits and expectations.  Discussed side effects.  Continue Flonase and recommend Zyrtec or Allegra.  If symptoms are not improving or if she develops purulent eye drainage or increased redness, can use erythromycin ointment to cover for infection, but no signs of infection are noted at this time.  Patient understands and agrees with care plan. Osteoporosis: Ordered bone density for reevaluation.  Has failed biphosphonate's and Prolia in the past. Screening mammogram ordered Patient to follow-up with GI to follow-up on colon cancer screening/surveillance colonoscopy.  She has no GI symptoms at this time  Follow up: For complete physical as scheduled 03/19/2023  Orders Placed This Encounter  Procedures   DG Bone Density   MM DIGITAL SCREENING BILATERAL   Meds ordered this encounter  Medications   methylPREDNISolone acetate (DEPO-MEDROL) injection 80 mg   erythromycin ophthalmic ointment    Sig: Place 1 Application into the left eye 3 (three) times daily.    Dispense:  3.5 g    Refill:  0   methylPREDNISolone acetate (DEPO-MEDROL) injection 80 mg      I reviewed the patients updated PMH, FH, and SocHx.    Patient Active Problem List   Diagnosis Date Noted   Insomnia secondary to chronic pain 08/23/2018    Priority: High   History of colon cancer- cecal adenocarcinoma 08/23/2018    Priority: High   S/P left hemicolectomy 08/23/2018    Priority: High   DJD (degenerative joint disease) of cervical spine  08/23/2018    Priority: High   Osteoporosis 08/23/2018    Priority: High   Chronic use of benzodiazepine for therapeutic purpose 08/23/2018    Priority: High   History of ulcerative colitis 08/23/2018    Priority: Medium    Seasonal allergic rhinitis due to pollen 03/15/2022    Priority: Low   Macular degeneration 08/23/2018    Priority: Low   Current Meds  Medication  Sig   Cetirizine HCl (ZYRTEC ALLERGY PO) Take by mouth.   erythromycin ophthalmic ointment Place 1 Application into the left eye 3 (three) times daily.   fluticasone (FLONASE) 50 MCG/ACT nasal spray Place into both nostrils daily.   LORazepam (ATIVAN) 1 MG tablet Take 1 tablet (1 mg total) by mouth 2 (two) times daily as needed for anxiety.   Multiple Vitamins-Minerals (PRESERVISION AREDS PO) Take by mouth.   NON FORMULARY 1 % every morning.   NON FORMULARY daily.   traMADol (ULTRAM) 50 MG tablet Take 2 tablets (100 mg total) by mouth 2 (two) times daily.   Current Facility-Administered Medications for the 03/15/23 encounter (Office Visit) with Willow Ora, MD  Medication   methylPREDNISolone acetate (DEPO-MEDROL) injection 80 mg   methylPREDNISolone acetate (DEPO-MEDROL) injection 80 mg    Allergies: Patient is allergic to prolia [denosumab] and phenergan [promethazine hcl]. Family History: Patient family history includes Arthritis in her mother; Congestive Heart Failure in her father; Depression in her son; Hypertension in her father; Prostate cancer in her father; Restless legs syndrome in her brother and father; Stroke in her mother. Social History:  Patient  reports that she has never smoked. She has never used smokeless tobacco. She reports current alcohol use. She reports that she does not use drugs.  Review of Systems: Constitutional: Negative for fever malaise or anorexia Cardiovascular: negative for chest pain Respiratory: negative for SOB or persistent cough Gastrointestinal: negative for abdominal pain  Objective  Vitals: BP 110/80   Pulse 69   Temp 98.3 F (36.8 C)   Ht  (1.626 m)   Wt 122 lb 3.2 oz (55.4 kg)   SpO2 98%   BMI 20.98 kg/m  General: no acute distress , A&Ox3 HEENT: PEERL, conjunctiva bilateral injection and chemosis present, clear eyelashes, minimal lid swelling  Commons side effects, risks, benefits, and alternatives for medications and  treatment plan prescribed today were discussed, and the patient expressed understanding of the given instructions. Patient is instructed to call or message via MyChart if he/she has any questions or concerns regarding our treatment plan. No barriers to understanding were identified. We discussed Red Flag symptoms and signs in detail. Patient expressed understanding regarding what to do in case of urgent or emergency type symptoms.  Medication list was reconciled, printed and provided to the patient in AVS. Patient instructions and summary information was reviewed with the patient as documented in the AVS. This note was prepared with assistance of Dragon voice recognition software. Occasional wrong-word or sound-a-like substitutions may have occurred due to the inherent limitations of voice recognition software

## 2023-03-19 ENCOUNTER — Other Ambulatory Visit: Payer: Self-pay | Admitting: Family Medicine

## 2023-03-19 ENCOUNTER — Other Ambulatory Visit (INDEPENDENT_AMBULATORY_CARE_PROVIDER_SITE_OTHER): Payer: Medicare Other

## 2023-03-19 ENCOUNTER — Ambulatory Visit (INDEPENDENT_AMBULATORY_CARE_PROVIDER_SITE_OTHER): Payer: Medicare Other | Admitting: Family Medicine

## 2023-03-19 ENCOUNTER — Encounter: Payer: Self-pay | Admitting: Family Medicine

## 2023-03-19 VITALS — BP 136/78 | HR 68 | Temp 97.7°F | Ht 64.0 in | Wt 120.8 lb

## 2023-03-19 DIAGNOSIS — E559 Vitamin D deficiency, unspecified: Secondary | ICD-10-CM | POA: Diagnosis not present

## 2023-03-19 DIAGNOSIS — G4701 Insomnia due to medical condition: Secondary | ICD-10-CM

## 2023-03-19 DIAGNOSIS — M810 Age-related osteoporosis without current pathological fracture: Secondary | ICD-10-CM | POA: Diagnosis not present

## 2023-03-19 DIAGNOSIS — G8929 Other chronic pain: Secondary | ICD-10-CM | POA: Diagnosis not present

## 2023-03-19 DIAGNOSIS — Z136 Encounter for screening for cardiovascular disorders: Secondary | ICD-10-CM | POA: Diagnosis not present

## 2023-03-19 DIAGNOSIS — M47812 Spondylosis without myelopathy or radiculopathy, cervical region: Secondary | ICD-10-CM | POA: Diagnosis not present

## 2023-03-19 DIAGNOSIS — Z780 Asymptomatic menopausal state: Secondary | ICD-10-CM

## 2023-03-19 DIAGNOSIS — Z85038 Personal history of other malignant neoplasm of large intestine: Secondary | ICD-10-CM

## 2023-03-19 DIAGNOSIS — Z1231 Encounter for screening mammogram for malignant neoplasm of breast: Secondary | ICD-10-CM

## 2023-03-19 LAB — CBC WITH DIFFERENTIAL/PLATELET
Basophils Absolute: 0 10*3/uL (ref 0.0–0.1)
Basophils Relative: 0.7 % (ref 0.0–3.0)
Eosinophils Absolute: 0.2 10*3/uL (ref 0.0–0.7)
Eosinophils Relative: 2.5 % (ref 0.0–5.0)
HCT: 41.2 % (ref 36.0–46.0)
Hemoglobin: 13.8 g/dL (ref 12.0–15.0)
Lymphocytes Relative: 32.8 % (ref 12.0–46.0)
Lymphs Abs: 2 10*3/uL (ref 0.7–4.0)
MCHC: 33.5 g/dL (ref 30.0–36.0)
MCV: 95.9 fl (ref 78.0–100.0)
Monocytes Absolute: 0.7 10*3/uL (ref 0.1–1.0)
Monocytes Relative: 11.4 % (ref 3.0–12.0)
Neutro Abs: 3.2 10*3/uL (ref 1.4–7.7)
Neutrophils Relative %: 52.6 % (ref 43.0–77.0)
Platelets: 245 10*3/uL (ref 150.0–400.0)
RBC: 4.3 Mil/uL (ref 3.87–5.11)
RDW: 14.4 % (ref 11.5–15.5)
WBC: 6.2 10*3/uL (ref 4.0–10.5)

## 2023-03-19 LAB — LIPID PANEL
Cholesterol: 188 mg/dL (ref 0–200)
HDL: 78 mg/dL (ref >39.00)
LDL Cholesterol: 94 mg/dL (ref 0–99)
NonHDL: 109.93
Total CHOL/HDL Ratio: 2
Triglycerides: 81 mg/dL (ref 0.0–149.0)
VLDL: 16.2 mg/dL (ref 0.0–40.0)

## 2023-03-19 LAB — COMPREHENSIVE METABOLIC PANEL
ALT: 10 U/L (ref 0–35)
AST: 17 U/L (ref 0–37)
Albumin: 4.1 g/dL (ref 3.5–5.2)
Alkaline Phosphatase: 61 U/L (ref 39–117)
BUN: 22 mg/dL (ref 6–23)
CO2: 28 mEq/L (ref 19–32)
Calcium: 9.6 mg/dL (ref 8.4–10.5)
Chloride: 101 mEq/L (ref 96–112)
Creatinine, Ser: 1.05 mg/dL (ref 0.40–1.20)
GFR: 52.75 mL/min — ABNORMAL LOW (ref >60.00)
Glucose, Bld: 96 mg/dL (ref 70–99)
Potassium: 5 mEq/L (ref 3.5–5.1)
Sodium: 138 mEq/L (ref 135–145)
Total Bilirubin: 0.5 mg/dL (ref 0.2–1.2)
Total Protein: 7.1 g/dL (ref 6.0–8.3)

## 2023-03-19 LAB — TSH: TSH: 3.7 u[IU]/mL (ref 0.35–5.50)

## 2023-03-19 LAB — VITAMIN D 25 HYDROXY (VIT D DEFICIENCY, FRACTURES): VITD: 43.82 ng/mL (ref 30.00–100.00)

## 2023-03-19 NOTE — Progress Notes (Signed)
Subjective  Chief Complaint  Patient presents with   Annual Exam    Pt here for Annual Exam and is currently fasting     HPI: Haley Huerta is a 72 y.o. female who presents to [facility] [place] today for a Female Wellness Visit. She also has the concerns and/or needs as listed above in the chief complaint. These will be addressed in addition to the Health Maintenance Visit.   Wellness Visit: annual visit with health maintenance review and exam without Pap  Health maintenance: Mammogram and bone density are scheduled for the fall.  Colonoscopy is due this year and she will schedule as well.  She is doing very well overall.  Was here last week for allergic conjunctivitis and has responded very well to treatment.  Feeling much better.  Immunizations are current.  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  Also uses chronic benzodiazepines for sleep.  Both are very well-controlled.  No changes in treatment is needed. History of colon cancer: No GI symptoms.  No weight loss or melena.  No abdominal pain.  No bowel problems.  He has colonoscopy scheduled for this year Osteoporosis: Due for reassessment.  DEXA is scheduled.  Would be a good candidate for Evista.  Has failed biphosphonate and Prolia in the past Healthy diet, low risk for ischemic heart disease.  Due screening labs History of MD deficiency due to recheck  Assessment  1. Insomnia secondary to chronic pain   2. History of colon cancer- cecal adenocarcinoma   3. Spondylosis of cervical region without myelopathy or radiculopathy   4. 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 Screening, ischemic heart disease   6. Vitamin D deficiency      Plan  Female Wellness Visit: Age appropriate Health Maintenance and Prevention measures were  discussed with patient. Included topics are cancer screening recommendations, ways to keep healthy (see AVS) including dietary and exercise recommendations, regular eye and dental care, use of seat belts, and avoidance of moderate alcohol use and tobacco use.  BMI: discussed patient's BMI and encouraged positive lifestyle modifications to help get to or maintain a target BMI. HM needs and immunizations were addressed and ordered. See below for orders. See HM and immunization section for updates. Routine labs and screening tests ordered including cmp, cbc and lipids where appropriate. Discussed recommendations regarding Vit D and calcium supplementation (see AVS)  Chronic disease management visit and/or acute problem visit: Chronic pain and insomnia are well-controlled: Continue benzodiazepine and tramadol. Osteoporosis: Follow-up DEXA scheduled.  Continue weightbearing exercise, calcium and vitamin D.  No fractures. Check fasting lab work. History of colon cancer for colonoscopy this year  Follow up: 12 months for complete physical Orders Placed This Encounter  Procedures   CBC with Differential/Platelet   Comprehensive metabolic panel   Lipid panel   TSH   VITAMIN D 25 Hydroxy (Vit-D Deficiency, Fractures)   No orders of the defined types were placed in this encounter.     Body mass index is 20.74 kg/m. Wt Readings from Last 3 Encounters:  03/19/23 120 lb 12.8 oz (54.8 kg)  03/15/23 122 lb 3.2 oz (55.4 kg)  03/15/22 121 lb (54.9 kg)     Patient Active Problem List   Diagnosis Date Noted   Insomnia secondary to chronic pain 08/23/2018    Priority: High  History of colon cancer- cecal adenocarcinoma 08/23/2018    Priority: High   S/P left hemicolectomy 08/23/2018    Priority: High    Chemo and radiation and hemicolectomy 2002    DJD (degenerative joint disease) of cervical spine 08/23/2018    Priority: High    Chronic pain on tramadol    Osteoporosis 08/23/2018     Priority: High    Dexa 08/2017 lowest T=-2.8; vit d and calcium; rec tx, to start prolia 09/2018- had allergic reaction to prolia. Dexa 2021 ordered: rec evista. Dexa 2022: T = -2.9 lumbar. Pt failed biphosphanates and prolia. Declines evista 08/2021: prefers to reassess in 2 years. Declines endocrine consult for further discussion at this time.     Chronic use of benzodiazepine for therapeutic purpose 08/23/2018    Priority: High    Uses for neck pain, muscle tension (failed tricyclics, mm relaxers) and neck pain. Has been ativan since 2003 since neck surgery.    History of ulcerative colitis 08/23/2018    Priority: Medium    Seasonal allergic rhinitis due to pollen 03/15/2022    Priority: Low   Macular degeneration 08/23/2018    Priority: Low   Health Maintenance  Topic Date Due   MAMMOGRAM  06/09/2022   COLONOSCOPY (Pts 45-34yrs Insurance coverage will need to be confirmed)  12/07/2022   Medicare Annual Wellness (AWV)  04/22/2023   COVID-19 Vaccine (7 - 2023-24 season) 03/31/2023 (Originally 10/30/2022)   DEXA SCAN  06/10/2023   INFLUENZA VACCINE  06/28/2023   DTaP/Tdap/Td (2 - Td or Tdap) 08/07/2024   Pneumonia Vaccine 53+ Years old  Completed   Hepatitis C Screening  Completed   Zoster Vaccines- Shingrix  Completed   HPV VACCINES  Aged Out   Immunization History  Administered Date(s) Administered   Fluad Quad(high Dose 65+) 07/29/2019, 08/25/2021   Influenza, High Dose Seasonal PF 08/23/2018, 08/25/2021, 09/04/2022   Influenza-Unspecified 08/30/2020   PFIZER Comirnaty(Gray Top)Covid-19 Tri-Sucrose Vaccine 03/21/2021, 09/04/2022   PFIZER(Purple Top)SARS-COV-2 Vaccination 01/18/2020, 02/11/2020, 08/30/2020, 08/16/2021   Pneumococcal Conjugate-13 12/10/2019   Pneumococcal Polysaccharide-23 08/23/2018   Tdap 08/07/2014   Zoster Recombinat (Shingrix) 09/26/2018, 01/13/2019   Zoster, Live 08/06/2013   We updated and reviewed the patient's past history in detail and it is  documented below. Allergies: Patient is allergic to prolia [denosumab] and phenergan [promethazine hcl]. Past Medical History Patient  has a past medical history of Allergy, Anxiety, Arthritis, Chronic use of benzodiazepine for therapeutic purpose (08/23/2018), Colon cancer (2001), DJD (degenerative joint disease) of cervical spine (08/23/2018), GERD (gastroesophageal reflux disease), History of ulcerative colitis (08/23/2018), Macular degeneration, and Osteoporosis (08/23/2018). Past Surgical History Patient  has a past surgical history that includes Abdominal hysterectomy; Hemicolectomy (2001); Cervical discectomy; Breast surgery; Appendectomy; and Cataract extraction (Bilateral). Family History: Patient family history includes Arthritis in her mother; Congestive Heart Failure in her father; Depression in her son; Hypertension in her father; Prostate cancer in her father; Restless legs syndrome in her brother and father; Stroke in her mother. Social History:  Patient  reports that she has never smoked. She has never used smokeless tobacco. She reports current alcohol use. She reports that she does not use drugs.  Review of Systems: Constitutional: negative for fever or malaise Ophthalmic: negative for photophobia, double vision or loss of vision Cardiovascular: negative for chest pain, dyspnea on exertion, or new LE swelling Respiratory: negative for SOB or persistent cough Gastrointestinal: negative for abdominal pain, change in bowel habits or melena Genitourinary: negative for dysuria or gross  hematuria, no abnormal uterine bleeding or disharge Musculoskeletal: negative for new gait disturbance or muscular weakness Integumentary: negative for new or persistent rashes, no breast lumps Neurological: negative for TIA or stroke symptoms Psychiatric: negative for SI or delusions Allergic/Immunologic: negative for hives  Patient Care Team    Relationship Specialty Notifications Start End   Willow Ora, MD PCP - General Family Medicine  08/23/18   Stephannie Li, MD Consulting Physician Ophthalmology  08/23/18   Jenel Lucks, MD Consulting Physician Gastroenterology  03/15/22     Objective  Vitals: BP 136/78   Pulse 68   Temp 97.7 F (36.5 C)   Ht 5\' 4"  (1.626 m)   Wt 120 lb 12.8 oz (54.8 kg)   SpO2 97%   BMI 20.74 kg/m  General:  Well developed, well nourished, no acute distress  Psych:  Alert and orientedx3,normal mood and affect HEENT:  Normocephalic, atraumatic, non-icteric sclera, normal eyelids and conjunctiva, supple neck without adenopathy, mass or thyromegaly Cardiovascular:  Normal S1, S2, RRR without gallop, rub or murmur Respiratory:  Good breath sounds bilaterally, CTAB with normal respiratory effort Gastrointestinal: normal bowel sounds, soft, non-tender, no noted masses. No HSM MSK: extremities without edema, joints without erythema or swelling Neurologic:    Mental status is normal.  Gross motor and sensory exams are normal.  No tremor  Commons side effects, risks, benefits, and alternatives for medications and treatment plan prescribed today were discussed, and the patient expressed understanding of the given instructions. Patient is instructed to call or message via MyChart if he/she has any questions or concerns regarding our treatment plan. No barriers to understanding were identified. We discussed Red Flag symptoms and signs in detail. Patient expressed understanding regarding what to do in case of urgent or emergency type symptoms.  Medication list was reconciled, printed and provided to the patient in AVS. Patient instructions and summary information was reviewed with the patient as documented in the AVS. This note was prepared with assistance of Dragon voice recognition software. Occasional wrong-word or sound-a-like substitutions may have occurred due to the inherent limitations of voice recognition software

## 2023-03-19 NOTE — Patient Instructions (Addendum)
Please return in 12 months for your annual complete physical; please come fasting.   Please go to our  [facility] [place] office to get your lab work done. You can walk in M-F between 8:30am- noon or 1pm - 5pm. Tell them you are there for labs ordered by me. They will send me the results, then I will let you know the results with instructions.   Address: [address].  The Lab department is located in the basement.    I will release your lab results to you on your MyChart account with further instructions. You may see the results before I do, but when I review them I will send you a message with my report or have my assistant call you if things need to be discussed. Please reply to my message with any questions. Thank you!   If you have any questions or concerns, please don't hesitate to send me a message via MyChart or call the office at [phone]. Thank you for visiting with [place] today! It's our pleasure caring for you.

## 2023-04-30 ENCOUNTER — Ambulatory Visit (INDEPENDENT_AMBULATORY_CARE_PROVIDER_SITE_OTHER): Payer: Medicare Other

## 2023-04-30 VITALS — Wt 120.0 lb

## 2023-04-30 DIAGNOSIS — Z0000 Encounter for general adult medical examination without abnormal findings: Secondary | ICD-10-CM | POA: Diagnosis not present

## 2023-04-30 NOTE — Patient Instructions (Signed)
Haley Huerta , Thank you for taking time to come for your Medicare Wellness Visit. I appreciate your ongoing commitment to your health goals. Please review the following plan we discussed and let me know if I can assist you in the future.   These are the goals we discussed:  Goals      Patient Stated     Increase social activity.      Patient Stated     Getting a stationary bike      Patient Stated     None at this time         This is a list of the screening recommended for you and due dates:  Health Maintenance  Topic Date Due   Mammogram  06/09/2022   COVID-19 Vaccine (7 - 2023-24 season) 10/30/2022   Colon Cancer Screening  12/07/2022   Medicare Annual Wellness Visit  04/22/2023   DEXA scan (bone density measurement)  06/10/2023   Flu Shot  06/28/2023   DTaP/Tdap/Td vaccine (2 - Td or Tdap) 08/07/2024   Pneumonia Vaccine  Completed   Hepatitis C Screening  Completed   Zoster (Shingles) Vaccine  Completed   HPV Vaccine  Aged Out    Advanced directives: Advance directive discussed with you today. I have provided a copy for you to complete at home and have notarized. Once this is complete please bring a copy in to our office so we can scan it into your chart.  Conditions/risks identified: get out more   Next appointment: Follow up in one year for your annual wellness visit    Preventive Care 65 Years and Older, Female Preventive care refers to lifestyle choices and visits with your health care provider that can promote health and wellness. What does preventive care include? A yearly physical exam. This is also called an annual well check. Dental exams once or twice a year. Routine eye exams. Ask your health care provider how often you should have your eyes checked. Personal lifestyle choices, including: Daily care of your teeth and gums. Regular physical activity. Eating a healthy diet. Avoiding tobacco and drug use. Limiting alcohol use. Practicing safe  sex. Taking low-dose aspirin every day. Taking vitamin and mineral supplements as recommended by your health care provider. What happens during an annual well check? The services and screenings done by your health care provider during your annual well check will depend on your age, overall health, lifestyle risk factors, and family history of disease. Counseling  Your health care provider may ask you questions about your: Alcohol use. Tobacco use. Drug use. Emotional well-being. Home and relationship well-being. Sexual activity. Eating habits. History of falls. Memory and ability to understand (cognition). Work and work Astronomer. Reproductive health. Screening  You may have the following tests or measurements: Height, weight, and BMI. Blood pressure. Lipid and cholesterol levels. These may be checked every 5 years, or more frequently if you are over 8 years old. Skin check. Lung cancer screening. You may have this screening every year starting at age 60 if you have a 30-pack-year history of smoking and currently smoke or have quit within the past 15 years. Fecal occult blood test (FOBT) of the stool. You may have this test every year starting at age 44. Flexible sigmoidoscopy or colonoscopy. You may have a sigmoidoscopy every 5 years or a colonoscopy every 10 years starting at age 60. Hepatitis C blood test. Hepatitis B blood test. Sexually transmitted disease (STD) testing. Diabetes screening. This is done by checking  your blood sugar (glucose) after you have not eaten for a while (fasting). You may have this done every 1-3 years. Bone density scan. This is done to screen for osteoporosis. You may have this done starting at age 76. Mammogram. This may be done every 1-2 years. Talk to your health care provider about how often you should have regular mammograms. Talk with your health care provider about your test results, treatment options, and if necessary, the need for more  tests. Vaccines  Your health care provider may recommend certain vaccines, such as: Influenza vaccine. This is recommended every year. Tetanus, diphtheria, and acellular pertussis (Tdap, Td) vaccine. You may need a Td booster every 10 years. Zoster vaccine. You may need this after age 36. Pneumococcal 13-valent conjugate (PCV13) vaccine. One dose is recommended after age 71. Pneumococcal polysaccharide (PPSV23) vaccine. One dose is recommended after age 58. Talk to your health care provider about which screenings and vaccines you need and how often you need them. This information is not intended to replace advice given to you by your health care provider. Make sure you discuss any questions you have with your health care provider. Document Released: 12/10/2015 Document Revised: 08/02/2016 Document Reviewed: 09/14/2015 Elsevier Interactive Patient Education  2017 [employer].  Fall Prevention in the Home Falls can cause injuries. They can happen to people of all ages. There are many things you can do to make your home safe and to help prevent falls. What can I do on the outside of my home? Regularly fix the edges of walkways and driveways and fix any cracks. Remove anything that might make you trip as you walk through a door, such as a raised step or threshold. Trim any bushes or trees on the path to your home. Use bright outdoor lighting. Clear any walking paths of anything that might make someone trip, such as rocks or tools. Regularly check to see if handrails are loose or broken. Make sure that both sides of any steps have handrails. Any raised decks and porches should have guardrails on the edges. Have any leaves, snow, or ice cleared regularly. Use sand or salt on walking paths during winter. Clean up any spills in your garage right away. This includes oil or grease spills. What can I do in the bathroom? Use night lights. Install grab bars by the toilet and in the tub and shower.  Do not use towel bars as grab bars. Use non-skid mats or decals in the tub or shower. If you need to sit down in the shower, use a plastic, non-slip stool. Keep the floor dry. Clean up any water that spills on the floor as soon as it happens. Remove soap buildup in the tub or shower regularly. Attach bath mats securely with double-sided non-slip rug tape. Do not have throw rugs and other things on the floor that can make you trip. What can I do in the bedroom? Use night lights. Make sure that you have a light by your bed that is easy to reach. Do not use any sheets or blankets that are too big for your bed. They should not hang down onto the floor. Have a firm chair that has side arms. You can use this for support while you get dressed. Do not have throw rugs and other things on the floor that can make you trip. What can I do in the kitchen? Clean up any spills right away. Avoid walking on wet floors. Keep items that you use a lot  in easy-to-reach places. If you need to reach something above you, use a strong step stool that has a grab bar. Keep electrical cords out of the way. Do not use floor polish or wax that makes floors slippery. If you must use wax, use non-skid floor wax. Do not have throw rugs and other things on the floor that can make you trip. What can I do with my stairs? Do not leave any items on the stairs. Make sure that there are handrails on both sides of the stairs and use them. Fix handrails that are broken or loose. Make sure that handrails are as long as the stairways. Check any carpeting to make sure that it is firmly attached to the stairs. Fix any carpet that is loose or worn. Avoid having throw rugs at the top or bottom of the stairs. If you do have throw rugs, attach them to the floor with carpet tape. Make sure that you have a light switch at the top of the stairs and the bottom of the stairs. If you do not have them, ask someone to add them for you. What else  can I do to help prevent falls? Wear shoes that: Do not have high heels. Have rubber bottoms. Are comfortable and fit you well. Are closed at the toe. Do not wear sandals. If you use a stepladder: Make sure that it is fully opened. Do not climb a closed stepladder. Make sure that both sides of the stepladder are locked into place. Ask someone to hold it for you, if possible. Clearly mark and make sure that you can see: Any grab bars or handrails. First and last steps. Where the edge of each step is. Use tools that help you move around (mobility aids) if they are needed. These include: Canes. Walkers. Scooters. Crutches. Turn on the lights when you go into a dark area. Replace any light bulbs as soon as they burn out. Set up your furniture so you have a clear path. Avoid moving your furniture around. If any of your floors are uneven, fix them. If there are any pets around you, be aware of where they are. Review your medicines with your doctor. Some medicines can make you feel dizzy. This can increase your chance of falling. Ask your doctor what other things that you can do to help prevent falls. This information is not intended to replace advice given to you by your health care provider. Make sure you discuss any questions you have with your health care provider. Document Released: 09/09/2009 Document Revised: 04/20/2016 Document Reviewed: 12/18/2014 Elsevier Interactive Patient Education  2017 [employer].

## 2023-04-30 NOTE — Progress Notes (Signed)
I connected with  Sharren Bridge on 04/30/23 by a audio enabled telemedicine application and verified that I am speaking with the correct person using two identifiers.  Patient Location: Home  Provider Location: Office/Clinic  I discussed the limitations of evaluation and management by telemedicine. The patient expressed understanding and agreed to proceed.   Patient Medicare AWV questionnaire was completed by the patient on 04/29/23; I have confirmed that all information answered by patient is correct and no changes since this date.      Subjective:   Haley Huerta is a 72 y.o. female who presents for Medicare Annual (Subsequent) preventive examination.  Review of Systems     Cardiac Risk Factors include: advanced age (>36men, >51 women)     Objective:    Today's Vitals   04/30/23 1112  Weight: 120 lb (54.4 kg)   Body mass index is 20.6 kg/m.     04/30/2023   11:19 AM 04/21/2022   11:01 AM 04/15/2021   11:11 AM 03/12/2020   12:37 PM 10/16/2018   10:26 AM  Advanced Directives  Does Patient Have a Medical Advance Directive? No No Yes No No  Type of Office manager of Healthcare Power of Attorney in Chart?   No - copy requested    Would patient like information on creating a medical advance directive? Yes (MAU/Ambulatory/Procedural Areas - Information given) Yes (MAU/Ambulatory/Procedural Areas - Information given)  Yes (MAU/Ambulatory/Procedural Areas - Information given) Yes (MAU/Ambulatory/Procedural Areas - Information given)    Current Medications (verified) Outpatient Encounter Medications as of 04/30/2023  Medication Sig   Cetirizine HCl (ZYRTEC ALLERGY PO) Take by mouth.   Cholecalciferol (VITAMIN D3) 10 MCG/ML LIQD Take by mouth.   fluticasone (FLONASE) 50 MCG/ACT nasal spray Place into both nostrils daily.   LORazepam (ATIVAN) 1 MG tablet Take 1 tablet (1 mg total) by mouth 2 (two) times daily as needed for anxiety.    Multiple Vitamins-Minerals (PRESERVISION AREDS PO) Take by mouth.   NON FORMULARY 1 % every morning. Protein mix   NON FORMULARY daily. Green miracle   tobramycin (TOBREX) 0.3 % ophthalmic solution PLEASE SEE ATTACHED FOR DETAILED DIRECTIONS   traMADol (ULTRAM) 50 MG tablet Take 2 tablets (100 mg total) by mouth 2 (two) times daily.   [DISCONTINUED] erythromycin ophthalmic ointment Place 1 Application into the left eye 3 (three) times daily.   No facility-administered encounter medications on file as of 04/30/2023.    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    Past Surgical History:  Procedure Laterality Date   ABDOMINAL HYSTERECTOMY     APPENDECTOMY     BREAST SURGERY     Implants   CATARACT EXTRACTION Bilateral    CERVICAL DISCECTOMY     Fusion    HEMICOLECTOMY  2001   R- Cancer  Family History  Problem Relation Age of Onset   Arthritis Mother    Stroke Mother    Prostate cancer Father    Congestive Heart Failure Father    Hypertension Father    Restless legs syndrome Father    Depression Son    Restless legs syndrome Brother    Social History   Socioeconomic History   Marital status: Divorced    Spouse name: Not on file   Number of children: 1   Years of education: Not on file   Highest education level: Bachelor's degree (e.g., BA, AB, BS)  Occupational History    Occupation: retired, Office manager etc  Tobacco Use   Smoking status: Never   Smokeless tobacco: Never  Vaping Use   Vaping Use: Never used  Substance and Sexual Activity   Alcohol use: Yes    Comment: rarely    Drug use: Never   Sexual activity: Not Currently    Birth control/protection: Post-menopausal  Other Topics Concern   Not on file  Social History Narrative   Previously lived in [place] moved to area to be closer to son    Social Determinants of Health   Financial Resource Strain: Low Risk  (04/29/2023)   Overall Financial Resource Strain (CARDIA)    Difficulty of Paying Living Expenses: Not hard at all  Food Insecurity: No Food Insecurity (04/29/2023)   Hunger Vital Sign    Worried About Running Out of Food in the Last Year: Never true    Ran Out of Food in the Last Year: Never true  Transportation Needs: No Transportation Needs (04/29/2023)   PRAPARE - Administrator, Civil Service (Medical): No    Lack of Transportation (Non-Medical): No  Physical Activity: Sufficiently Active (04/29/2023)   Exercise Vital Sign    Days of Exercise per Week: 5 days    Minutes of Exercise per Session: 30 min  Recent Concern: Physical Activity - Insufficiently Active (03/15/2023)   Exercise Vital Sign    Days of Exercise per Week: 4 days    Minutes of Exercise per Session: 30 min  Stress: Stress Concern Present (04/29/2023)   [employer] of Occupational Health - Occupational Stress Questionnaire    Feeling of Stress : To some extent  Social Connections: Socially Isolated (04/29/2023)   Social Connection and Isolation Panel [NHANES]    Frequency of Communication with Friends and Family: More than three times a week    Frequency of Social Gatherings with Friends and Family: Twice a week    Attends Religious Services: Never    Database administrator or Organizations: No    Attends Engineer, structural: Never    Marital Status: Divorced    Tobacco  Counseling Counseling given: Not Answered   Clinical Intake:  Pre-visit preparation completed: Yes  Pain : No/denies pain     BMI - recorded: 20.6 Nutritional Status: BMI of 19-24  Normal Nutritional Risks: None Diabetes: No  How often do you need to have someone help you when you read instructions, pamphlets, or other written materials from your doctor or pharmacy?: 1 - Never  Diabetic?no  Interpreter Needed?: No  Information entered by :: Lanier Ensign, LPN   Activities of Daily Living    04/29/2023    3:17 PM  In your present state of health, do you have any difficulty performing the following activities:  Hearing? 0  Vision? 1  Comment macular degeneration  Difficulty concentrating or making decisions? 0  Walking or climbing stairs? 0  Dressing or bathing? 0  Doing errands, shopping? 0  Preparing Food and eating ? N  Using the Toilet? N  In the past six months, have you accidently leaked urine? N  Do you have problems with loss of bowel control? N  Managing your Medications? N  Managing your Finances? N  Housekeeping or managing your Housekeeping? N    Patient Care Team: Willow Ora, MD as PCP - General (Family Medicine) Stephannie Li, MD as Consulting Physician (Ophthalmology) Jenel Lucks, MD as Consulting Physician (Gastroenterology)  Indicate any recent Medical Services you may have received from other than [facility] providers in the past year (date may be approximate).     Assessment:   This is a routine wellness examination for [place].  Hearing/Vision screen Hearing Screening - Comments:: Pt denies any hearing issues  Vision Screening - Comments:: [facility] with [facility] will be switching this year   Dietary issues and exercise activities discussed: Current Exercise Habits: Home exercise routine, Type of exercise: walking, Time (Minutes): 30, Frequency (Times/Week): 5, Weekly Exercise (Minutes/Week): 150   Goals Addressed              This Visit's Progress    Patient Stated       Get out more        Depression Screen    04/30/2023   11:17 AM 03/19/2023   10:26 AM 03/15/2023   12:55 PM 04/21/2022   10:59 AM 04/15/2021   11:09 AM 03/03/2021    9:56 AM 03/12/2020    1:27 PM  PHQ 2/9 Scores  PHQ - 2 Score 0 0 0 0 0 0 0    Fall Risk    04/29/2023    3:17 PM 03/19/2023   10:26 AM 03/15/2023   12:55 PM 04/21/2022   11:02 AM 03/15/2022   10:46 AM  Fall Risk   Falls in the past year? 0 0 0 0 0  Number falls in past yr: 0 0 0 0 0  Injury with Fall? 0 0 0 0 0  Risk for fall due to : Impaired vision No Fall Risks No Fall Risks Impaired vision   Follow up Falls prevention discussed Falls evaluation completed Falls evaluation completed Falls prevention discussed Falls evaluation completed    FALL RISK PREVENTION PERTAINING TO THE HOME:  Any stairs in or around the home? No  If so, are there any without handrails? No  Home free of loose throw rugs in walkways, pet beds, electrical cords, etc? Yes  Adequate lighting in your home to reduce risk of falls? Yes   ASSISTIVE DEVICES UTILIZED TO PREVENT FALLS:  Life alert? No  Use of a cane, walker or w/c? No  Grab bars in the bathroom? Yes  Shower chair or bench in shower? Yes  Elevated toilet seat or a handicapped toilet? No   TIMED UP AND GO:  Was the test performed? No .   Cognitive Function:    10/16/2018   10:30 AM  MMSE - Mini Mental State Exam  Orientation to time 4  Orientation to Place 5  Registration 3  Attention/ Calculation 5  Recall 2  Language- name 2 objects 2  Language- repeat 1  Language- follow 3 step command 3  Language- read & follow direction 1  Write a sentence 1  Copy design 1  Total score 28        04/30/2023   11:23 AM 04/21/2022  11:03 AM 04/15/2021   11:16 AM 03/12/2020   12:38 PM  6CIT Screen  What Year? 0 points 0 points 0 points 0 points  What month? 0 points 0 points 0 points 0 points  What time? 0 points 0  points  0 points  Count back from 20 0 points 0 points 0 points 0 points  Months in reverse 0 points 0 points 0 points 0 points  Repeat phrase 0 points 0 points 0 points 0 points  Total Score 0 points 0 points  0 points    Immunizations Immunization History  Administered Date(s) Administered   Fluad Quad(high Dose 65+) 07/29/2019, 08/25/2021   Influenza, High Dose Seasonal PF 08/23/2018, 08/25/2021, 09/04/2022   Influenza-Unspecified 08/30/2020   PFIZER Comirnaty(Gray Top)Covid-19 Tri-Sucrose Vaccine 03/21/2021, 09/04/2022   PFIZER(Purple Top)SARS-COV-2 Vaccination 01/18/2020, 02/11/2020, 08/30/2020, 08/16/2021   Pneumococcal Conjugate-13 12/10/2019   Pneumococcal Polysaccharide-23 08/23/2018   Tdap 08/07/2014   Zoster Recombinat (Shingrix) 09/26/2018, 01/13/2019   Zoster, Live 08/06/2013    TDAP status: Up to date  Flu Vaccine status: Up to date  Pneumococcal vaccine status: Up to date  Covid-19 vaccine status: Completed vaccines  Qualifies for Shingles Vaccine? Yes   Zostavax completed Yes   Shingrix Completed?: Yes  Screening Tests Health Maintenance  Topic Date Due   MAMMOGRAM  06/09/2022   COVID-19 Vaccine (7 - 2023-24 season) 10/30/2022   Colonoscopy  12/07/2022   DEXA SCAN  06/10/2023   INFLUENZA VACCINE  06/28/2023   Medicare Annual Wellness (AWV)  04/29/2024   DTaP/Tdap/Td (2 - Td or Tdap) 08/07/2024   Pneumonia Vaccine 88+ Years old  Completed   Hepatitis C Screening  Completed   Zoster Vaccines- Shingrix  Completed   HPV VACCINES  Aged Out    Health Maintenance  Health Maintenance Due  Topic Date Due   MAMMOGRAM  06/09/2022   COVID-19 Vaccine (7 - 2023-24 season) 10/30/2022   Colonoscopy  12/07/2022    Pt will schedule   Mammogram status: Completed 06/09/21. Repeat every year  Bone Density status: Completed 06/09/21. Results reflect: Bone density results: OSTEOPOROSIS. Repeat every 2 years.   Additional Screening:  Hepatitis C Screening:   Completed 10/16/18  Vision Screening: Recommended annual ophthalmology exams for early detection of glaucoma and other disorders of the eye. Is the patient up to date with their annual eye exam?  Yes  Who is the provider or what is the name of the office in which the patient attends annual eye exams? [facility]  If pt is not established with a provider, would they like to be referred to a provider to establish care? No .   Dental Screening: Recommended annual dental exams for proper oral hygiene  Community Resource Referral / Chronic Care Management: CRR required this visit?  No   CCM required this visit?  No      Plan:     I have personally reviewed and noted the following in the patient's chart:   Medical and social history Use of alcohol, tobacco or illicit drugs  Current medications and supplements including opioid prescriptions. Patient is not currently taking opioid prescriptions. Functional ability and status Nutritional status Physical activity Advanced directives List of other physicians Hospitalizations, surgeries, and ER visits in previous 12 months Vitals Screenings to include cognitive, depression, and falls Referrals and appointments  In addition, I have reviewed and discussed with patient certain preventive protocols, quality metrics, and best practice recommendations. A written personalized care plan for preventive services as well  as general preventive health recommendations were provided to patient.     Marzella Schlein, LPN   08/01/2840   Nurse Notes: none

## 2023-05-09 DIAGNOSIS — H353111 Nonexudative age-related macular degeneration, right eye, early dry stage: Secondary | ICD-10-CM | POA: Diagnosis not present

## 2023-05-09 DIAGNOSIS — H353221 Exudative age-related macular degeneration, left eye, with active choroidal neovascularization: Secondary | ICD-10-CM | POA: Diagnosis not present

## 2023-05-09 DIAGNOSIS — H43813 Vitreous degeneration, bilateral: Secondary | ICD-10-CM | POA: Diagnosis not present

## 2023-06-04 ENCOUNTER — Encounter: Payer: Self-pay | Admitting: Family Medicine

## 2023-06-06 MED ORDER — TRAMADOL HCL 50 MG PO TABS: 100.0000 mg | ORAL_TABLET | Freq: Two times a day (BID) | ORAL | 5 refills | Status: DC

## 2023-06-06 MED ORDER — LORAZEPAM 1 MG PO TABS: 1.0000 mg | ORAL_TABLET | Freq: Two times a day (BID) | ORAL | 5 refills | Status: DC | PRN

## 2023-06-08 ENCOUNTER — Encounter: Payer: Self-pay | Admitting: Gastroenterology

## 2023-07-25 DIAGNOSIS — H43813 Vitreous degeneration, bilateral: Secondary | ICD-10-CM | POA: Diagnosis not present

## 2023-07-25 DIAGNOSIS — H353112 Nonexudative age-related macular degeneration, right eye, intermediate dry stage: Secondary | ICD-10-CM | POA: Diagnosis not present

## 2023-07-25 DIAGNOSIS — H353221 Exudative age-related macular degeneration, left eye, with active choroidal neovascularization: Secondary | ICD-10-CM | POA: Diagnosis not present

## 2023-09-06 DIAGNOSIS — Z23 Encounter for immunization: Secondary | ICD-10-CM | POA: Diagnosis not present

## 2023-09-28 ENCOUNTER — Ambulatory Visit (AMBULATORY_SURGERY_CENTER): Payer: Medicare Other

## 2023-09-28 VITALS — Ht 64.0 in | Wt 120.0 lb

## 2023-09-28 DIAGNOSIS — H353122 Nonexudative age-related macular degeneration, left eye, intermediate dry stage: Secondary | ICD-10-CM | POA: Diagnosis not present

## 2023-09-28 DIAGNOSIS — Z85038 Personal history of other malignant neoplasm of large intestine: Secondary | ICD-10-CM

## 2023-09-28 MED ORDER — PEG 3350-KCL-NA BICARB-NACL 420 G PO SOLR: 4000.0000 mL | Freq: Once | ORAL | 0 refills | Status: AC

## 2023-09-28 NOTE — Progress Notes (Addendum)
Pre visit completed via phone call; Patient verified name, DOB, and address; No egg allergy known to patient;  PATIENT reports SOY allergy- causes itching/rash/hives- no reaction noted on previous colonoscopy report in 11/2017 with use of MAC sedation;  No issues known to pt with past sedation with any surgeries or procedures; Patient denies ever being told they had issues or difficulty with intubation;  No FH of Malignant Hyperthermia; Pt is not on diet pills; Pt is not on home 02;  Pt is not on blood thinners;  Pt reports issues with constipation- increases oral fluids, activity, fruits/veggies prior to the prep starting- patient reports she has to make sure she "eats correctly to prevent";  No A fib or A flutter; Have any cardiac testing pending--NO Insurance verified during PV appt--- Medicare A/B Pt can ambulate without assistance;  Pt denies use of chewing tobacco; Discussed diabetic/weight loss medication holds; Discussed NSAID holds; Checked BMI to be less than 50; Pt instructed to use Singlecare.com or GoodRx for a price reduction on prep;  Patient's chart reviewed by Cathlyn Parsons CNRA prior to previsit and patient appropriate for the [facility]; Pre visit completed and red dot placed by patient's name on their procedure day (on provider's schedule);   Instructions sent to MyChart per patient request;

## 2023-10-03 ENCOUNTER — Ambulatory Visit
Admission: RE | Admit: 2023-10-03 | Discharge: 2023-10-03 | Disposition: A | Discharge status: Home / Self Care | Payer: Medicare Other | Source: Ambulatory Visit | Attending: Family Medicine | Admitting: Family Medicine

## 2023-10-03 DIAGNOSIS — E2839 Other primary ovarian failure: Secondary | ICD-10-CM | POA: Diagnosis not present

## 2023-10-03 DIAGNOSIS — N958 Other specified menopausal and perimenopausal disorders: Secondary | ICD-10-CM | POA: Diagnosis not present

## 2023-10-03 DIAGNOSIS — Z1231 Encounter for screening mammogram for malignant neoplasm of breast: Secondary | ICD-10-CM

## 2023-10-03 DIAGNOSIS — M8588 Other specified disorders of bone density and structure, other site: Secondary | ICD-10-CM | POA: Diagnosis not present

## 2023-10-03 DIAGNOSIS — Z780 Asymptomatic menopausal state: Secondary | ICD-10-CM

## 2023-10-03 DIAGNOSIS — M810 Age-related osteoporosis without current pathological fracture: Secondary | ICD-10-CM

## 2023-10-10 DIAGNOSIS — H353221 Exudative age-related macular degeneration, left eye, with active choroidal neovascularization: Secondary | ICD-10-CM | POA: Diagnosis not present

## 2023-10-10 DIAGNOSIS — H43813 Vitreous degeneration, bilateral: Secondary | ICD-10-CM | POA: Diagnosis not present

## 2023-10-10 DIAGNOSIS — H353112 Nonexudative age-related macular degeneration, right eye, intermediate dry stage: Secondary | ICD-10-CM | POA: Diagnosis not present

## 2023-10-19 ENCOUNTER — Ambulatory Visit: Payer: Medicare Other | Admitting: Gastroenterology

## 2023-10-19 ENCOUNTER — Encounter: Payer: Self-pay | Admitting: Gastroenterology

## 2023-10-19 VITALS — BP 134/65 | HR 65 | Temp 97.5°F | Resp 13 | Ht 64.0 in | Wt 120.0 lb

## 2023-10-19 DIAGNOSIS — K644 Residual hemorrhoidal skin tags: Secondary | ICD-10-CM | POA: Diagnosis not present

## 2023-10-19 DIAGNOSIS — F419 Anxiety disorder, unspecified: Secondary | ICD-10-CM | POA: Diagnosis not present

## 2023-10-19 DIAGNOSIS — Z85038 Personal history of other malignant neoplasm of large intestine: Secondary | ICD-10-CM

## 2023-10-19 DIAGNOSIS — K56609 Unspecified intestinal obstruction, unspecified as to partial versus complete obstruction: Secondary | ICD-10-CM | POA: Diagnosis not present

## 2023-10-19 DIAGNOSIS — K635 Polyp of colon: Secondary | ICD-10-CM | POA: Diagnosis not present

## 2023-10-19 DIAGNOSIS — Z1211 Encounter for screening for malignant neoplasm of colon: Secondary | ICD-10-CM | POA: Diagnosis not present

## 2023-10-19 DIAGNOSIS — Z980 Intestinal bypass and anastomosis status: Secondary | ICD-10-CM | POA: Diagnosis not present

## 2023-10-19 DIAGNOSIS — D124 Benign neoplasm of descending colon: Secondary | ICD-10-CM

## 2023-10-19 DIAGNOSIS — Z79899 Other long term (current) drug therapy: Secondary | ICD-10-CM | POA: Diagnosis not present

## 2023-10-19 MED ORDER — SODIUM CHLORIDE 0.9 % IV SOLN: 500.0000 mL | Freq: Once | INTRAVENOUS | Status: DC

## 2023-10-19 NOTE — Patient Instructions (Signed)

## 2023-10-19 NOTE — Op Note (Signed)
[facility] Patient Name: Haley Huerta Procedure Date: 10/19/2023 9:10 AM MRN: [id number] Endoscopist: Lorin Picket E. Tomasa Rand , MD, [id number] Age: 72 Referring MD:  Date of Birth: 01/06/1951 Gender: Female Account #: [id number] Procedure:                Colonoscopy Indications:              High risk colon cancer surveillance: Personal                            history of colon cancer Medicines:                Monitored Anesthesia Care Procedure:                Pre-Anesthesia Assessment:                           - Prior to the procedure, a History and Physical                            was performed, and patient medications and                            allergies were reviewed. The patient's tolerance of                            previous anesthesia was also reviewed. The risks                            and benefits of the procedure and the sedation                            options and risks were discussed with the patient.                            All questions were answered, and informed consent                            was obtained. Prior Anticoagulants: The patient has                            taken no anticoagulant or antiplatelet agents. ASA                            Grade Assessment: II - A patient with mild systemic                            disease. After reviewing the risks and benefits,                            the patient was deemed in satisfactory condition to                            undergo the procedure.  After obtaining informed consent, the colonoscope                            was passed under direct vision. Throughout the                            procedure, the patient's blood pressure, pulse, and                            oxygen saturations were monitored continuously. The                            [id number]-[id number] Colonoscope [id number] was introduced                            through the anus and advanced  to the the                            ileocolonic anastomosis. The Olympus Scope SN                            [id number] was introduced through the anus and                            advanced to sigmoid colon before being switched out                            for a pediatric colonoscope. The colonoscopy was                            somewhat difficult due to restricted mobility of                            the colon and significant looping. Successful                            completion of the procedure was aided by                            withdrawing the scope and replacing with the                            pediatric endoscope. The patient tolerated the                            procedure well. The quality of the bowel                            preparation was adequate. Ileocolonic anastomosis                            were photographed. The bowel preparation used was  GoLYTELY via split dose instruction. Scope In: 9:25:25 AM Scope Out: 9:44:09 AM Scope Withdrawal Time: 0 hours 9 minutes 45 seconds  Total Procedure Duration: 0 hours 18 minutes 44 seconds  Findings:                 Skin tags were found on perianal exam.                           The digital rectal exam was normal. Pertinent                            negatives include normal sphincter tone and no                            palpable rectal lesions.                           A 3 mm polyp was found in the descending colon. The                            polyp was sessile. The polyp was removed with a                            cold snare. Resection and retrieval were complete.                            Estimated blood loss was minimal.                           There was evidence of a prior side-to-side                            ileo-colonic anastomosis in the transverse colon.                            This was patent and was characterized by stenosis.                             The anastomosis was not able to be traversed                            secondary to looping and suspected stenosis.                           The exam was otherwise normal throughout the                            examined colon.                           The retroflexed view of the distal rectum and anal                            verge was normal and showed no anal or rectal  abnormalities. Complications:            No immediate complications. Impression:               - Perianal skin tags found on perianal exam.                           - One 3 mm polyp in the descending colon, removed                            with a cold snare. Resected and retrieved.                           - Patent side-to-side ileo-colonic anastomosis,                            characterized by stenosis. Unable to be intubated.                           - The distal rectum and anal verge are normal on                            retroflexion view. Recommendation:           - Patient has a contact number available for                            emergencies. The signs and symptoms of potential                            delayed complications were discussed with the                            patient. Return to normal activities tomorrow.                            Written discharge instructions were provided to the                            patient.                           - Resume previous diet.                           - Continue present medications.                           - Await pathology results.                           - Consider repeat colonoscopy in 5 years for                            surveillance. Recommend office visit in 5 years to  discuss ongoing colon cancer surveillance at that                            time. Natanya Holecek E. Tomasa Rand, MD 10/19/2023 9:53:35 AM This report has been signed electronically.

## 2023-10-19 NOTE — Progress Notes (Signed)
[facility] History and Physical   Primary Care Physician:  Willow Ora, MD   Reason for Procedure:   History of colon cancer  Plan:    Surveillance colonoscopy     HPI: Haley Huerta is a 72 y.o. female undergoing surveillance colonoscopy.  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  Most recent colonoscopy 2019 which was normal.    Past Medical History:  Diagnosis Date   Allergy    Anxiety    Arthritis    Chronic use of benzodiazepine for therapeutic purpose 08/23/2018   Uses for neck pain, muscle tension (failed tricyclics, mm relaxers) and neck pain.   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     Past Surgical History:  Procedure Laterality Date   ABDOMINAL HYSTERECTOMY     APPENDECTOMY     AUGMENTATION MAMMAPLASTY     BREAST SURGERY Bilateral    Implants   CATARACT EXTRACTION Bilateral 2022   CERVICAL DISCECTOMY     Fusion    HEMICOLECTOMY  2001   R- Cancer     Prior to Admission medications   Medication Sig Start Date End Date Taking? Authorizing Provider  Cetirizine HCl (ZYRTEC ALLERGY PO) Take 1 tablet by mouth daily at 6 (six) AM.   Yes [provider]  Cholecalciferol (VITAMIN D3) 10 MCG/ML LIQD Take 1 Dose by mouth daily at 6 (six) AM.   Yes [provider]  fluticasone (FLONASE) 50 MCG/ACT nasal spray Place 2 sprays into both nostrils daily.   Yes [provider]  LORazepam (ATIVAN) 1 MG tablet Take 1 tablet (1 mg total) by mouth 2 (two) times daily as needed for anxiety. 06/06/23  Yes Willow Ora, MD  Multiple Vitamins-Minerals (PRESERVISION AREDS PO)  Take 1 tablet by mouth daily at 6 (six) AM.   Yes [provider]  NON FORMULARY 1 % every morning. Protein mix   Yes [provider]  NON FORMULARY daily. Green miracle   Yes [provider]  traMADol (ULTRAM) 50 MG tablet Take 2 tablets (100 mg total) by mouth 2 (two) times daily. 06/06/23  Yes Willow Ora, MD  tobramycin (TOBREX) 0.3 % ophthalmic solution PLEASE SEE ATTACHED FOR DETAILED DIRECTIONS Patient not taking: Reported on 10/19/2023 03/01/23   [provider]    Current Outpatient Medications  Medication Sig Dispense Refill   Cetirizine HCl (ZYRTEC ALLERGY PO) Take 1 tablet by mouth daily at 6 (six) AM.     Cholecalciferol (VITAMIN D3) 10 MCG/ML LIQD Take 1 Dose by mouth daily at 6 (six) AM.     fluticasone (FLONASE) 50 MCG/ACT nasal spray Place 2 sprays into both nostrils daily.     LORazepam (ATIVAN) 1 MG tablet Take 1 tablet (1 mg total) by mouth 2 (two) times daily as needed for anxiety. 45 tablet 5   Multiple Vitamins-Minerals (PRESERVISION AREDS PO) Take 1 tablet by mouth daily at 6 (six) AM.     NON FORMULARY 1 % every morning. Protein mix     NON FORMULARY daily. Green miracle     traMADol (ULTRAM) 50 MG tablet Take 2 tablets (100  mg total) by mouth 2 (two) times daily. 120 tablet 5   tobramycin (TOBREX) 0.3 % ophthalmic solution PLEASE SEE ATTACHED FOR DETAILED DIRECTIONS (Patient not taking: Reported on 10/19/2023)     Current Facility-Administered Medications  Medication Dose Route Frequency Provider Last Rate Last Admin   0.9 %  sodium chloride infusion  500 mL Intravenous Once Jenel Lucks, MD        Allergies as of 10/19/2023 - Review Complete 10/19/2023  Allergen Reaction Noted   Prolia [denosumab] Itching and Dermatitis 12/10/2019   Latex Hives, Itching, Photosensitivity, and Rash 09/28/2023   Phenergan [promethazine hcl] Other (See Comments) 10/16/2018   Soy allergy Hives, Itching, Photosensitivity, and Rash  09/28/2023    Family History  Problem Relation Age of Onset   Arthritis Mother    Stroke Mother    Prostate cancer Father 99   Congestive Heart Failure Father    Hypertension Father    Restless legs syndrome Father    Cancer - Other Father 60       MOUTH CANCER-caused byt serious gengivitis   Restless legs syndrome Brother    Depression Son    Colon polyps Neg Hx    Colon cancer Neg Hx    Esophageal cancer Neg Hx    Rectal cancer Neg Hx    Stomach cancer Neg Hx    BRCA 1/2 Neg Hx    Breast cancer Neg Hx     Social History   Socioeconomic History   Marital status: Divorced    Spouse name: Not on file   Number of children: 1   Years of education: Not on file   Highest education level: Bachelor's degree (e.g., BA, AB, BS)  Occupational History   Occupation: retired, Office manager etc  Tobacco Use   Smoking status: Never   Smokeless tobacco: Never  Vaping Use   Vaping status: Never Used  Substance and Sexual Activity   Alcohol use: Not Currently    Comment: rarely    Drug use: Never   Sexual activity: Not Currently    Birth control/protection: Post-menopausal  Other Topics Concern   Not on file  Social History Narrative   Previously lived in [place] moved to area to be closer to son    Social Determinants of Health   Financial Resource Strain: Low Risk  (04/29/2023)   Overall Financial Resource Strain (CARDIA)    Difficulty of Paying Living Expenses: Not hard at all  Food Insecurity: No Food Insecurity (04/29/2023)   Hunger Vital Sign    Worried About Running Out of Food in the Last Year: Never true    Ran Out of Food in the Last Year: Never true  Transportation Needs: No Transportation Needs (04/29/2023)   PRAPARE - Administrator, Civil Service (Medical): No    Lack of Transportation (Non-Medical): No  Physical Activity: Sufficiently Active (04/29/2023)   Exercise Vital Sign    Days of Exercise per Week: 5 days    Minutes of Exercise per  Session: 30 min  Recent Concern: Physical Activity - Insufficiently Active (03/15/2023)   Exercise Vital Sign    Days of Exercise per Week: 4 days    Minutes of Exercise per Session: 30 min  Stress: Stress Concern Present (04/29/2023)   [employer] of Occupational Health - Occupational Stress Questionnaire    Feeling of Stress : To some extent  Social Connections: Socially Isolated (04/29/2023)   Social Connection and Isolation Panel [NHANES]  Frequency of Communication with Friends and Family: More than three times a week    Frequency of Social Gatherings with Friends and Family: Twice a week    Attends Religious Services: Never    Database administrator or Organizations: No    Attends Banker Meetings: Never    Marital Status: Divorced  Catering manager Violence: Not At Risk (04/30/2023)   Humiliation, Afraid, Rape, and Kick questionnaire    Fear of Current or Ex-Partner: No    Emotionally Abused: No    Physically Abused: No    Sexually Abused: No    Review of Systems:  All other review of systems negative except as mentioned in the HPI.  Physical Exam: Vital signs BP (!) 158/78   Pulse 70   Temp (!) 97.5 F (36.4 C) (Skin)   Ht 5\' 4"  (1.626 m)   Wt 120 lb (54.4 kg)   SpO2 98%   BMI 20.60 kg/m   General:   Alert,  Well-developed, well-nourished, pleasant and cooperative in NAD Airway:  Mallampati 2 Lungs:  Clear throughout to auscultation.   Heart:  Regular rate and rhythm; no murmurs, clicks, rubs,  or gallops. Abdomen:  Soft, nontender and nondistended. Normal bowel sounds.   Neuro/Psych:  Normal mood and affect. A and O x 3   Hisae Decoursey E. Tomasa Rand, MD [facility]

## 2023-10-19 NOTE — Progress Notes (Signed)
Called to room to assist during endoscopic procedure.  Patient ID and intended procedure confirmed with present staff. Received instructions for my participation in the procedure from the performing physician.  

## 2023-10-19 NOTE — Progress Notes (Signed)
Vss nad trans to pacu 

## 2023-10-19 NOTE — Progress Notes (Signed)
Pt's states no medical or surgical changes since previsit or office visit. 

## 2023-10-22 ENCOUNTER — Telehealth: Payer: Self-pay | Admitting: *Deleted

## 2023-10-22 NOTE — Telephone Encounter (Signed)
  Follow up Call-     10/19/2023    8:19 AM  Call back number  Post procedure Call Back phone  # [phone]  Permission to leave phone message Yes     Patient questions:   Message left to call if necessary.

## 2023-10-24 LAB — SURGICAL PATHOLOGY

## 2023-10-25 ENCOUNTER — Encounter: Payer: Self-pay | Admitting: Internal Medicine

## 2023-10-28 DIAGNOSIS — H353122 Nonexudative age-related macular degeneration, left eye, intermediate dry stage: Secondary | ICD-10-CM | POA: Diagnosis not present

## 2023-10-30 NOTE — Progress Notes (Signed)
Ms. Budman,  The polyp removed during your colonoscopy was completely benign with no precancerous potential.  Based on your personal history of colon cancer, it would be recommended to repeat colonoscopy in 5 years.   Because colon cancer screening after age 74 is done on a case-by-case basis, taking into account the patient's risk factors for colon cancer, as well as comorbidities and life expectancy, I would recommend you make an appointment with me in 5 years to discuss the risks/benefits of further colon cancer screening.

## 2023-11-09 ENCOUNTER — Encounter: Payer: Self-pay | Admitting: Family Medicine

## 2023-11-12 MED ORDER — TRAMADOL HCL 50 MG PO TABS: 100.0000 mg | ORAL_TABLET | Freq: Two times a day (BID) | ORAL | 5 refills | Status: DC

## 2023-11-12 MED ORDER — LORAZEPAM 1 MG PO TABS: 1.0000 mg | ORAL_TABLET | Freq: Two times a day (BID) | ORAL | 5 refills | Status: DC | PRN

## 2023-11-27 DIAGNOSIS — H353122 Nonexudative age-related macular degeneration, left eye, intermediate dry stage: Secondary | ICD-10-CM | POA: Diagnosis not present

## 2023-12-26 DIAGNOSIS — H43813 Vitreous degeneration, bilateral: Secondary | ICD-10-CM | POA: Diagnosis not present

## 2023-12-26 DIAGNOSIS — H353112 Nonexudative age-related macular degeneration, right eye, intermediate dry stage: Secondary | ICD-10-CM | POA: Diagnosis not present

## 2023-12-26 DIAGNOSIS — H353221 Exudative age-related macular degeneration, left eye, with active choroidal neovascularization: Secondary | ICD-10-CM | POA: Diagnosis not present

## 2023-12-27 DIAGNOSIS — H353122 Nonexudative age-related macular degeneration, left eye, intermediate dry stage: Secondary | ICD-10-CM | POA: Diagnosis not present

## 2024-01-26 DIAGNOSIS — H353122 Nonexudative age-related macular degeneration, left eye, intermediate dry stage: Secondary | ICD-10-CM | POA: Diagnosis not present

## 2024-02-25 DIAGNOSIS — H353122 Nonexudative age-related macular degeneration, left eye, intermediate dry stage: Secondary | ICD-10-CM | POA: Diagnosis not present

## 2024-03-12 DIAGNOSIS — H43813 Vitreous degeneration, bilateral: Secondary | ICD-10-CM | POA: Diagnosis not present

## 2024-03-12 DIAGNOSIS — H353221 Exudative age-related macular degeneration, left eye, with active choroidal neovascularization: Secondary | ICD-10-CM | POA: Diagnosis not present

## 2024-03-12 DIAGNOSIS — H353112 Nonexudative age-related macular degeneration, right eye, intermediate dry stage: Secondary | ICD-10-CM | POA: Diagnosis not present

## 2024-03-19 ENCOUNTER — Encounter: Payer: Self-pay | Admitting: Family Medicine

## 2024-03-19 ENCOUNTER — Ambulatory Visit: Payer: Medicare Other | Admitting: Family Medicine

## 2024-03-19 VITALS — BP 146/73 | HR 69 | Temp 98.0°F | Ht 64.0 in | Wt 122.4 lb

## 2024-03-19 DIAGNOSIS — E559 Vitamin D deficiency, unspecified: Secondary | ICD-10-CM | POA: Diagnosis not present

## 2024-03-19 DIAGNOSIS — G4701 Insomnia due to medical condition: Secondary | ICD-10-CM

## 2024-03-19 DIAGNOSIS — M810 Age-related osteoporosis without current pathological fracture: Secondary | ICD-10-CM

## 2024-03-19 DIAGNOSIS — Z79899 Other long term (current) drug therapy: Secondary | ICD-10-CM

## 2024-03-19 DIAGNOSIS — M47812 Spondylosis without myelopathy or radiculopathy, cervical region: Secondary | ICD-10-CM

## 2024-03-19 DIAGNOSIS — G8929 Other chronic pain: Secondary | ICD-10-CM | POA: Diagnosis not present

## 2024-03-19 DIAGNOSIS — J301 Allergic rhinitis due to pollen: Secondary | ICD-10-CM | POA: Diagnosis not present

## 2024-03-19 DIAGNOSIS — Z85038 Personal history of other malignant neoplasm of large intestine: Secondary | ICD-10-CM

## 2024-03-19 LAB — COMPREHENSIVE METABOLIC PANEL WITH GFR
ALT: 8 U/L (ref 0–35)
AST: 18 U/L (ref 0–37)
Albumin: 3.9 g/dL (ref 3.5–5.2)
Alkaline Phosphatase: 54 U/L (ref 39–117)
BUN: 13 mg/dL (ref 6–23)
CO2: 31 meq/L (ref 19–32)
Calcium: 9.3 mg/dL (ref 8.4–10.5)
Chloride: 102 meq/L (ref 96–112)
Creatinine, Ser: 0.97 mg/dL (ref 0.40–1.20)
GFR: 57.6 mL/min — ABNORMAL LOW (ref >60.00)
Glucose, Bld: 91 mg/dL (ref 70–99)
Potassium: 4.1 meq/L (ref 3.5–5.1)
Sodium: 138 meq/L (ref 135–145)
Total Bilirubin: 0.5 mg/dL (ref 0.2–1.2)
Total Protein: 6.7 g/dL (ref 6.0–8.3)

## 2024-03-19 LAB — CBC WITH DIFFERENTIAL/PLATELET
Basophils Absolute: 0 10*3/uL (ref 0.0–0.1)
Basophils Relative: 1 % (ref 0.0–3.0)
Eosinophils Absolute: 0.1 10*3/uL (ref 0.0–0.7)
Eosinophils Relative: 3.4 % (ref 0.0–5.0)
HCT: 40.9 % (ref 36.0–46.0)
Hemoglobin: 13.7 g/dL (ref 12.0–15.0)
Lymphocytes Relative: 35.2 % (ref 12.0–46.0)
Lymphs Abs: 1.4 10*3/uL (ref 0.7–4.0)
MCHC: 33.4 g/dL (ref 30.0–36.0)
MCV: 97.3 fl (ref 78.0–100.0)
Monocytes Absolute: 0.5 10*3/uL (ref 0.1–1.0)
Monocytes Relative: 13.1 % — ABNORMAL HIGH (ref 3.0–12.0)
Neutro Abs: 1.9 10*3/uL (ref 1.4–7.7)
Neutrophils Relative %: 47.3 % (ref 43.0–77.0)
Platelets: 216 10*3/uL (ref 150.0–400.0)
RBC: 4.21 Mil/uL (ref 3.87–5.11)
RDW: 13.4 % (ref 11.5–15.5)
WBC: 4.1 10*3/uL (ref 4.0–10.5)

## 2024-03-19 LAB — TSH: TSH: 6.79 u[IU]/mL — ABNORMAL HIGH (ref 0.35–5.50)

## 2024-03-19 LAB — VITAMIN D 25 HYDROXY (VIT D DEFICIENCY, FRACTURES): VITD: 46.7 ng/mL (ref 30.00–100.00)

## 2024-03-19 MED ORDER — RALOXIFENE HCL 60 MG PO TABS
60.0000 mg | ORAL_TABLET | Freq: Every day | ORAL | 3 refills | Status: AC
Start: 2024-03-19 — End: ?

## 2024-03-19 NOTE — Patient Instructions (Signed)
 Please return in 12 months for your annual complete physical; please come fasting.   I will release your lab results to you on your MyChart account with further instructions. You may see the results before I do, but when I review them I will send you a message with my report or have my assistant call you if things need to be discussed. Please reply to my message with any questions. Thank you!   Please take vit D and daily calcium supplements.   If you have any questions or concerns, please don't hesitate to send me a message via MyChart or call the office at [phone]. Thank you for visiting with us  today! It's our pleasure caring for you.   VISIT SUMMARY:  Today, we reviewed your overall health, including the results of your recent colonoscopy and bone density test. We also discussed your vaccinations, advance directives, and medication needs.  YOUR PLAN:  -COLONIC STENOSIS: Colonic stenosis means a narrowing of the colon. You should continue with your dietary modifications to prevent any blockages.  -OSTEOPOROSIS: Osteoporosis is a condition where bones become weak and brittle. Your T-score is -2.9, indicating osteoporosis. We discussed Evista  use and I have ordered this for you.   -GOALS OF CARE: We discussed the importance of advance directives and DNR/DNI paperwork. It's important to communicate your healthcare wishes with your family and consider updating your living will to reflect your specific wishes in case of terminal illness or vegetative state.  -WELLNESS VISIT: We reviewed your overall health, including your colonoscopy and bone density results, and discussed your COVID-19 and pneumococcal vaccinations. We also talked about the importance of advance directives and DNR/DNI paperwork. Basic lab work, including vitamin D  and B12 levels, will be ordered, and a mammogram will be scheduled for November. You should update your pneumococcal vaccine to Prevnar 20 next year and get an  annual COVID-19 vaccination.  INSTRUCTIONS:  Please follow up with the basic lab work, including vitamin D  and B12 levels. Schedule your mammogram for November. Update your pneumococcal vaccine to Prevnar 20 next year and get an annual COVID-19 vaccination. Discuss Evista  and Evenity as treatment options for osteoporosis with your insurance provider. Review and discuss your advance directives and DNR/DNI paperwork with your family, and consider updating your living will.

## 2024-03-19 NOTE — Progress Notes (Signed)
 Subjective  Chief Complaint  Patient presents with   Annual Exam    Pt here for Annual Exam and is currently fasting     HPI: Haley Huerta is a 73 y.o. female who presents to [facility] [place] today for a Female Wellness Visit. She also has the concerns and/or needs as listed above in the chief complaint. These will be addressed in addition to the Health Maintenance Visit.   Wellness Visit: annual visit with health maintenance review and exam  HM: mammo and CRC screen are current. Feels well. Active. Imms up to date Chronic disease f/u and/or acute problem visit: (deemed necessary to be done in addition to the wellness visit): Discussed the use of AI scribe software for clinical note transcription with the patient, who gave verbal consent to proceed.  History of Present Illness   Haley Huerta "Fredrik Jensen" is a 73 year old female who presents for a follow-up on bone density and colonoscopy results.  She recently underwent a colonoscopy which revealed stenosis, a new finding for her. She is cautious with her diet to prevent blockages. During the procedure, a small nodule was noted, but it was not classified as a polyp. She occasionally experiences soreness at the anastomosis site from her colon surgery in 2001, though she maintains regular bowel movements.  Her bone density was evaluated in November, showing stable osteoporosis, but her T-score remains at -2.9, indicating osteoporosis. She has previously tried biphosphonates and injectable treatments(prolia ) but experienced adverse skin reactions, including a rash and dry, itchy skin. She is hesitant to try new medications due to these past experiences. She has not been taking her liquid vitamin D  since the beginning of the year.  She is currently using a senior care discount for her lorazepam  prescription due to insurance issues and anticipates needing a new prescription in June due to insurance requirements.   She uses lorazepam  for sleep and tramadol  for chronic neck pain   She received her last pneumococcal vaccine in 2021 and is due for an update next year. She is considering updating her DNR and DNI paperwork and has discussed her wishes regarding resuscitation and end-of-life care with her family.       Assessment  1. Spondylosis of cervical region without myelopathy or radiculopathy   2. Chronic use of benzodiazepine for therapeutic purpose   3. History of colon cancer- cecal adenocarcinoma   4. Insomnia secondary to chronic pain   5. Age-related osteoporosis without current pathological fracture   6. Seasonal allergic rhinitis due to pollen      Plan  Female Wellness Visit: Age appropriate Health Maintenance and Prevention measures were discussed with patient. Included topics are cancer screening recommendations, ways to keep healthy (see AVS) including dietary and exercise recommendations, regular eye and dental care, use of seat belts, and avoidance of moderate alcohol use and tobacco use.  BMI: discussed patient's BMI and encouraged positive lifestyle modifications to help get to or maintain a target BMI. HM needs and immunizations were addressed and ordered. See below for orders. See HM and immunization section for updates. Routine labs and screening tests ordered including cmp, cbc and lipids where appropriate. Discussed recommendations regarding Vit D and calcium supplementation (see AVS)  Chronic disease management visit and/or acute problem visit: Assessment and Plan    Wellness Visit Reviewed overall health, colonoscopy, bone density, COVID-19, and pneumococcal vaccination. Discussed advance directives and DNR/DNI. - Order basic lab work including vitamin D  and B12 levels. -  Schedule mammogram for November. - Update pneumococcal vaccine to Prevnar 20 next year. - Recommend annual COVID-19 vaccination for those over 65. - discussed advance care planning.   History of colon  cancer: Colonic stenosis Colonic stenosis identified with no significant obstruction. Managed with dietary modifications. - Continue dietary modifications.  Osteoporosis Osteoporosis with T-score at -2.9. Previous injectable treatment caused skin reactions. Discussed Evista  and Evenity as treatment options. Evista  preferred due to past reactions, Evenity considered if covered and safe. - Discuss Evista  as a treatment option. Pt elects this treatment. Ordered. Should tolerate well  Chronic neck pain and insomnia remain stable on lorazepam  and tramadol .   Goals of Care Discussed advance directives and DNR/DNI. Advised on importance of communication and documentation in a living will. - Review and discuss advance directives and DNR/DNI paperwork. Pt is a full code at this time.  - Encourage discussion with family about healthcare wishes. - Consider updating living will to reflect specific wishes in the event of terminal illness or vegetative state.       Follow up: 12 mo for cpe  No orders of the defined types were placed in this encounter.  No orders of the defined types were placed in this encounter.     Body mass index is 21.01 kg/m. Wt Readings from Last 3 Encounters:  03/19/24 122 lb 6.4 oz (55.5 kg)  10/19/23 120 lb (54.4 kg)  09/28/23 120 lb (54.4 kg)     Patient Active Problem List   Diagnosis Date Noted Date Diagnosed   Insomnia secondary to chronic pain 08/23/2018     Priority: High   History of colon cancer- cecal adenocarcinoma 08/23/2018     Priority: High   S/P left hemicolectomy 08/23/2018     Priority: High    Chemo and radiation and hemicolectomy 2002    DJD (degenerative joint disease) of cervical spine 08/23/2018     Priority: High    Chronic pain on tramadol     Osteoporosis 08/23/2018     Priority: High    Dexa 08/2017 lowest T=-2.8; vit d and calcium; rec tx, to start prolia  09/2018- had allergic reaction to prolia . Dexa 2021 ordered: rec  evista . Dexa 2022: T = -2.9 lumbar. Pt failed biphosphanates and prolia . Declines evista  08/2021: prefers to reassess in 2 years. Declines endocrine consult for further discussion at this time.     Chronic use of benzodiazepine for therapeutic purpose 08/23/2018     Priority: High    Uses for neck pain, muscle tension (failed tricyclics, mm relaxers) and neck pain. Has been ativan  since 2003 since neck surgery.    History of ulcerative colitis 08/23/2018     Priority: Medium    Seasonal allergic rhinitis due to pollen 03/15/2022     Priority: Low   Macular degeneration 08/23/2018     Priority: Low   Health Maintenance  Topic Date Due   Medicare Annual Wellness (AWV)  04/29/2024   COVID-19 Vaccine (8 - Pfizer risk 2024-25 season) 04/04/2024 (Originally 03/06/2024)   INFLUENZA VACCINE  06/27/2024   DTaP/Tdap/Td (2 - Td or Tdap) 08/07/2024   MAMMOGRAM  10/02/2024   DEXA SCAN  10/02/2025   Colonoscopy  10/18/2028   Pneumonia Vaccine 50+ Years old  Completed   Hepatitis C Screening  Completed   Zoster Vaccines- Shingrix  Completed   HPV VACCINES  Aged Out   Meningococcal B Vaccine  Aged Out   Immunization History  Administered Date(s) Administered   Fluad Quad(high Dose 65+) 07/29/2019,  08/25/2021   Fluad Trivalent(High Dose 65+) 09/06/2023   Influenza, High Dose Seasonal PF 08/23/2018, 08/25/2021, 09/04/2022   Influenza-Unspecified 08/30/2020   PFIZER Comirnaty(Gray Top)Covid-19 Tri-Sucrose Vaccine 03/21/2021, 09/04/2022   PFIZER(Purple Top)SARS-COV-2 Vaccination 01/18/2020, 02/11/2020, 08/30/2020, 08/16/2021   Pfizer(Comirnaty)Fall Seasonal Vaccine 12 years and older 09/06/2023   Pneumococcal Conjugate-13 12/10/2019   Pneumococcal Polysaccharide-23 08/23/2018   Tdap 08/07/2014   Zoster Recombinant(Shingrix) 09/26/2018, 01/13/2019   Zoster, Live 08/06/2013   We updated and reviewed the patient's past history in detail and it is documented below. Allergies: Patient is allergic  to prolia  [denosumab ], latex, phenergan [promethazine hcl], and soy allergy (obsolete). Past Medical History Patient  has a past medical history of Allergy, Anxiety, Arthritis, Chronic use of benzodiazepine for therapeutic purpose (08/23/2018), Colon cancer (HCC) (2001), DJD (degenerative joint disease) of cervical spine (08/23/2018), GERD (gastroesophageal reflux disease), History of ulcerative colitis (08/23/2018), Macular degeneration, and Osteoporosis (08/23/2018). Past Surgical History Patient  has a past surgical history that includes Abdominal hysterectomy; Hemicolectomy (2001); Cervical discectomy; Breast surgery (Bilateral); Appendectomy; Cataract extraction (Bilateral, 2022); and Augmentation mammaplasty. Family History: Patient family history includes Arthritis in her mother; Cancer - Other (age of onset: 48) in her father; Congestive Heart Failure in her father; Depression in her son; Hypertension in her father; Prostate cancer (age of onset: 29) in her father; Restless legs syndrome in her brother and father; Stroke in her mother. Social History:  Patient  reports that she has never smoked. She has never used smokeless tobacco. She reports that she does not currently use alcohol. She reports that she does not use drugs.  Review of Systems: Constitutional: negative for fever or malaise Ophthalmic: negative for photophobia, double vision or loss of vision Cardiovascular: negative for chest pain, dyspnea on exertion, or new LE swelling Respiratory: negative for SOB or persistent cough Gastrointestinal: negative for abdominal pain, change in bowel habits or melena Genitourinary: negative for dysuria or gross hematuria, no abnormal uterine bleeding or disharge Musculoskeletal: negative for new gait disturbance or muscular weakness Integumentary: negative for new or persistent rashes, no breast lumps Neurological: negative for TIA or stroke symptoms Psychiatric: negative for SI or  delusions Allergic/Immunologic: negative for hives  Patient Care Team    Relationship Specialty Notifications Start End  Luevenia Saha, MD PCP - General Family Medicine  08/23/18   Linard Reno, MD Consulting Physician Ophthalmology  08/23/18   Elois Hair, MD Consulting Physician Gastroenterology  03/15/22     Objective  Vitals: BP (!) 146/73   Pulse 69   Temp 98 F (36.7 C)   Ht 5\' 4"  (1.626 m)   Wt 122 lb 6.4 oz (55.5 kg)   SpO2 97%   BMI 21.01 kg/m  General:  Well developed, well nourished, no acute distress  Psych:  Alert and orientedx3,normal mood and affect HEENT:  Normocephalic, atraumatic, non-icteric sclera,  supple neck without adenopathy, mass or thyromegaly Cardiovascular:  Normal S1, S2, RRR without gallop, rub or murmur Respiratory:  Good breath sounds bilaterally, CTAB with normal respiratory effort Gastrointestinal: normal bowel sounds, soft, non-tender, no noted masses. No HSM MSK: extremities without edema, joints without erythema or swelling Neurologic:    Mental status is normal.  Gross motor and sensory exams are normal.  No tremor  Commons side effects, risks, benefits, and alternatives for medications and treatment plan prescribed today were discussed, and the patient expressed understanding of the given instructions. Patient is instructed to call or message via MyChart if he/she has any questions or  concerns regarding our treatment plan. No barriers to understanding were identified. We discussed Red Flag symptoms and signs in detail. Patient expressed understanding regarding what to do in case of urgent or emergency type symptoms.  Medication list was reconciled, printed and provided to the patient in AVS. Patient instructions and summary information was reviewed with the patient as documented in the AVS. This note was prepared with assistance of Dragon voice recognition software. Occasional wrong-word or sound-a-like substitutions may have occurred  due to the inherent limitations of voice recognition software

## 2024-03-20 ENCOUNTER — Encounter: Payer: Self-pay | Admitting: Family Medicine

## 2024-03-20 DIAGNOSIS — E038 Other specified hypothyroidism: Secondary | ICD-10-CM | POA: Insufficient documentation

## 2024-03-20 NOTE — Progress Notes (Signed)
 See mychart note Dear Ms. Rachael Budd, Your lab results look good overall. Your thyroid  test shows mild elevation indicating "subclinical hypothyroidism" ... This can lead to overt hypothyroidism or low functioning thyroid . I recommend monitoring for symptoms of low thyroid : hair and skin changes, swelling, fatigue, weight gain and I can recheck levels in 6-12 months.  All other labs are stable  Sincerely, Dr. Jonelle Neri

## 2024-03-26 DIAGNOSIS — H353122 Nonexudative age-related macular degeneration, left eye, intermediate dry stage: Secondary | ICD-10-CM | POA: Diagnosis not present

## 2024-04-25 DIAGNOSIS — H353112 Nonexudative age-related macular degeneration, right eye, intermediate dry stage: Secondary | ICD-10-CM | POA: Diagnosis not present

## 2024-05-07 ENCOUNTER — Ambulatory Visit

## 2024-05-07 VITALS — Ht 64.5 in | Wt 122.0 lb

## 2024-05-07 DIAGNOSIS — Z0000 Encounter for general adult medical examination without abnormal findings: Secondary | ICD-10-CM

## 2024-05-07 NOTE — Patient Instructions (Signed)
 Ms. Copen , Thank you for taking time out of your busy schedule to complete your Annual Wellness Visit with me. I enjoyed our conversation and look forward to speaking with you again next year. I, as well as your care team,  appreciate your ongoing commitment to your health goals. Please review the following plan we discussed and let me know if I can assist you in the future. Your Game plan/ To Do List    Referrals: If you haven't heard from the office you've been referred to, please reach out to them at the phone provided.   Follow up Visits: Next Medicare AWV with our clinical staff: 05/12/25   Have you seen your provider in the last 6 months (3 months if uncontrolled diabetes)? Yes Next Office Visit with your provider: 02/2025  Clinician Recommendations:  Aim for 30 minutes of exercise or brisk walking, 6-8 glasses of water, and 5 servings of fruits and vegetables each day.       This is a list of the screening recommended for you and due dates:  Health Maintenance  Topic Date Due   COVID-19 Vaccine (8 - Pfizer risk 2024-25 season) 03/06/2024   Flu Shot  06/27/2024   DTaP/Tdap/Td vaccine (2 - Td or Tdap) 08/07/2024   Mammogram  10/02/2024   Medicare Annual Wellness Visit  05/07/2025   DEXA scan (bone density measurement)  10/02/2025   Colon Cancer Screening  10/18/2028   Pneumonia Vaccine  Completed   Hepatitis C Screening  Completed   Zoster (Shingles) Vaccine  Completed   HPV Vaccine  Aged Out   Meningitis B Vaccine  Aged Out    Advanced directives: (Declined) Advance directive discussed with you today. Even though you declined this today, please call our office should you change your mind, and we can give you the proper paperwork for you to fill out. Advance Care Planning is important because it:  [x]  Makes sure you receive the medical care that is consistent with your values, goals, and preferences  [x]  It provides guidance to your family and loved ones and reduces their  decisional burden about whether or not they are making the right decisions based on your wishes.  Follow the link provided in your after visit summary or read over the paperwork we have mailed to you to help you started getting your Advance Directives in place. If you need assistance in completing these, please reach out to us  so that we can help you!  See attachments for Preventive Care and Fall Prevention Tips.

## 2024-05-07 NOTE — Progress Notes (Signed)
 Subjective:   Haley Huerta is a 73 y.o. who presents for a Medicare Wellness preventive visit.  As a reminder, Annual Wellness Visits don't include a physical exam, and some assessments may be limited, especially if this visit is performed virtually. We may recommend an in-person follow-up visit with your provider if needed.  Visit Complete: Virtual I connected with  Caragh A Tetzlaff on 05/07/24 by a audio enabled telemedicine application and verified that I am speaking with the correct person using two identifiers.  Patient Location: Home  Provider Location: Office/Clinic  I discussed the limitations of evaluation and management by telemedicine. The patient expressed understanding and agreed to proceed.  Vital Signs: Because this visit was a virtual/telehealth visit, some criteria may be missing or patient reported. Any vitals not documented were not able to be obtained and vitals that have been documented are patient reported.  VideoDeclined- This patient declined Librarian, academic. Therefore the visit was completed with audio only.  Persons Participating in Visit: Patient.  AWV Questionnaire: No: Patient Medicare AWV questionnaire was not completed prior to this visit.  Cardiac Risk Factors include: advanced age (>73men, >86 women)     Objective:     Today's Vitals   05/07/24 1307  Weight: 122 lb (55.3 kg)  Height: 5' 4.5 (1.638 m)   Body mass index is 20.62 kg/m.     05/07/2024    1:13 PM 04/30/2023   11:19 AM 04/21/2022   11:01 AM 04/15/2021   11:11 AM 03/12/2020   12:37 PM 10/16/2018   10:26 AM  Advanced Directives  Does Patient Have a Medical Advance Directive? No No No Yes No No  Type of Research officer, political party of Healthcare Power of Attorney in Chart?    No - copy requested    Would patient like information on creating a medical advance directive? No - Patient declined Yes  (MAU/Ambulatory/Procedural Areas - Information given) Yes (MAU/Ambulatory/Procedural Areas - Information given)  Yes (MAU/Ambulatory/Procedural Areas - Information given) Yes (MAU/Ambulatory/Procedural Areas - Information given)    Current Medications (verified) Outpatient Encounter Medications as of 05/07/2024  Medication Sig   Cetirizine HCl (ZYRTEC ALLERGY PO) Take 1 tablet by mouth daily at 6 (six) AM.   Cholecalciferol (VITAMIN D3) 10 MCG/ML LIQD Take 1 Dose by mouth daily at 6 (six) AM.   fluticasone (FLONASE) 50 MCG/ACT nasal spray Place 2 sprays into both nostrils daily.   LORazepam  (ATIVAN ) 1 MG tablet Take 1 tablet (1 mg total) by mouth 2 (two) times daily as needed for anxiety.   Multiple Vitamins-Minerals (PRESERVISION AREDS PO) Take 1 tablet by mouth daily at 6 (six) AM.   NON FORMULARY 1 % every morning. Protein mix   NON FORMULARY daily. Green miracle   raloxifene  (EVISTA ) 60 MG tablet Take 1 tablet (60 mg total) by mouth daily.   tobramycin (TOBREX) 0.3 % ophthalmic solution    traMADol  (ULTRAM ) 50 MG tablet Take 2 tablets (100 mg total) by mouth 2 (two) times daily.   No facility-administered encounter medications on file as of 05/07/2024.    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    Past Surgical History:  Procedure Laterality Date    ABDOMINAL HYSTERECTOMY     APPENDECTOMY     AUGMENTATION MAMMAPLASTY     BREAST SURGERY Bilateral    Implants   CATARACT EXTRACTION Bilateral 2022   CERVICAL DISCECTOMY     Fusion    HEMICOLECTOMY  2001   R- Cancer    Family History  Problem Relation Age of Onset   Arthritis Mother    Stroke Mother    Prostate cancer Father 57   Congestive Heart Failure Father    Hypertension Father    Restless legs syndrome Father    Cancer - Other Father 24       MOUTH CANCER-caused byt serious gengivitis   Restless legs syndrome Brother    Depression Son    Colon polyps Neg Hx    Colon cancer Neg Hx    Esophageal cancer Neg Hx    Rectal cancer Neg Hx    Stomach cancer Neg Hx    BRCA 1/2 Neg Hx    Breast cancer Neg Hx    Social History   Socioeconomic History   Marital status: Divorced    Spouse name: Not on file   Number of children: 1   Years of education: Not on file   Highest education level: Bachelor's degree (e.g., BA, AB, BS)  Occupational History   Occupation: retired, Office manager etc  Tobacco Use   Smoking status: Never   Smokeless tobacco: Never  Vaping Use   Vaping status: Never Used  Substance and Sexual Activity   Alcohol use: Not Currently    Comment: rarely    Drug use: Never   Sexual activity: Not Currently    Birth control/protection: Post-menopausal  Other Topics Concern   Not on file  Social History Narrative   Previously lived in [place]  moved to area to be closer to son    Social Drivers of Health   Financial Resource Strain: Low Risk  (05/07/2024)   Overall Financial Resource Strain (CARDIA)    Difficulty of Paying Living Expenses: Not hard at all  Food Insecurity: No Food Insecurity (05/07/2024)   Hunger Vital Sign    Worried About Running Out of Food in the Last Year: Never true    Ran Out of Food in the Last Year: Never true  Transportation Needs: No Transportation Needs (05/07/2024)   PRAPARE - Scientist, research (physical sciences) (Medical): No    Lack of Transportation (Non-Medical): No  Physical Activity: Sufficiently Active (05/07/2024)   Exercise Vital Sign    Days of Exercise per Week: 3 days    Minutes of Exercise per Session: 100 min  Stress: No Stress Concern Present (05/07/2024)   [employer] of Occupational Health - Occupational Stress Questionnaire    Feeling of Stress : Only a little  Social Connections: Socially Isolated (05/07/2024)   Social Connection and Isolation Panel [NHANES]    Frequency of Communication with Friends and Family: Three times a week    Frequency of Social Gatherings with Friends and Family: Three times a week    Attends Religious Services: Never  Active Member of Clubs or Organizations: No    Attends Banker Meetings: Never    Marital Status: Divorced    Tobacco Counseling Counseling given: Not Answered    Clinical Intake:  Pre-visit preparation completed: Yes  Pain : No/denies pain     BMI - recorded: 20.62 Nutritional Status: BMI of 19-24  Normal Nutritional Risks: None Diabetes: No  Lab Results  Component Value Date   HGBA1C 5.7 08/28/2017     How often do you need to have someone help you when you read instructions, pamphlets, or other written materials from your doctor or pharmacy?: 1 - Never  Interpreter Needed?: No  Information entered by :: Lamont Pilsner, LPN   Activities of Daily Living     05/07/2024    1:09 PM  In your present state of health, do you have any difficulty performing the following activities:  Hearing? 0  Vision? 0  Difficulty concentrating or making decisions? 0  Walking or climbing stairs? 0  Dressing or bathing? 0  Doing errands, shopping? 0  Preparing Food and eating ? N  Using the Toilet? N  In the past six months, have you accidently leaked urine? N  Do you have problems with loss of bowel control? N  Managing your Medications? N  Managing your Finances? N  Housekeeping or  managing your Housekeeping? N    Patient Care Team: Luevenia Saha, MD as PCP - General (Family Medicine) Linard Reno, MD as Consulting Physician (Ophthalmology) Elois Hair, MD as Consulting Physician (Gastroenterology)  I have updated your Care Teams any recent Medical Services you may have received from other providers in the past year.     Assessment:    This is a routine wellness examination for [place].  Hearing/Vision screen Hearing Screening - Comments:: Pt denies any hearing issues  Vision Screening - Comments:: Wears rx glasses - up to date with [facility] eye exams with [facility]     Goals Addressed             This Visit's Progress    Patient Stated       Be more active        Depression Screen     05/07/2024    1:11 PM 03/19/2024    9:31 AM 04/30/2023   11:17 AM 03/19/2023   10:26 AM 03/15/2023   12:55 PM 04/21/2022   10:59 AM 04/15/2021   11:09 AM  PHQ 2/9 Scores  PHQ - 2 Score 0 0 0 0 0 0 0    Fall Risk     05/07/2024    1:13 PM 03/19/2024    9:28 AM 04/29/2023    3:17 PM 03/19/2023   10:26 AM 03/15/2023   12:55 PM  Fall Risk   Falls in the past year? 0 0 0 0 0  Number falls in past yr: 0 0 0 0 0  Injury with Fall? 0 0 0 0 0  Risk for fall due to : No Fall Risks No Fall Risks Impaired vision No Fall Risks No Fall Risks  Follow up Falls prevention discussed Falls evaluation completed Falls prevention discussed Falls evaluation completed Falls evaluation completed    MEDICARE RISK AT HOME: Medicare Risk at Home Any stairs in or around the home?: No If so, are there any without handrails?: No Home free of loose throw rugs in walkways, pet beds, electrical cords, etc?: Yes Adequate lighting in your home to reduce risk of falls?: Yes Life  alert?: No Use of a cane, walker or w/c?: No Grab bars in the bathroom?: Yes Shower chair or bench in shower?: Yes Elevated toilet seat or a handicapped toilet?: No  TIMED UP AND GO:  Was the  test performed?  No  Cognitive Function: 6CIT completed    10/16/2018   10:30 AM  MMSE - Mini Mental State Exam  Orientation to time 4  Orientation to Place 5  Registration 3  Attention/ Calculation 5  Recall 2  Language- name 2 objects 2  Language- repeat 1  Language- follow 3 step command 3  Language- read & follow direction 1  Write a sentence 1  Copy design 1  Total score 28        05/07/2024    1:14 PM 04/30/2023   11:23 AM 04/21/2022   11:03 AM 04/15/2021   11:16 AM 03/12/2020   12:38 PM  6CIT Screen  What Year? 0 points 0 points 0 points 0 points 0 points  What month? 0 points 0 points 0 points 0 points 0 points  What time? 0 points 0 points 0 points  0 points  Count back from 20 0 points 0 points 0 points 0 points 0 points  Months in reverse 0 points 0 points 0 points 0 points 0 points  Repeat phrase 0 points 0 points 0 points 0 points 0 points  Total Score 0 points 0 points 0 points  0 points    Immunizations Immunization History  Administered Date(s) Administered   Fluad Quad(high Dose 65+) 07/29/2019, 08/25/2021   Fluad Trivalent(High Dose 65+) 09/06/2023   Influenza, High Dose Seasonal PF 08/23/2018, 08/25/2021, 09/04/2022   Influenza-Unspecified 08/30/2020   PFIZER Comirnaty(Gray Top)Covid-19 Tri-Sucrose Vaccine 03/21/2021, 09/04/2022   PFIZER(Purple Top)SARS-COV-2 Vaccination 01/18/2020, 02/11/2020, 08/30/2020, 08/16/2021   Pfizer(Comirnaty)Fall Seasonal Vaccine 12 years and older 09/06/2023   Pneumococcal Conjugate-13 12/10/2019   Pneumococcal Polysaccharide-23 08/23/2018   Tdap 08/07/2014   Zoster Recombinant(Shingrix) 09/26/2018, 01/13/2019   Zoster, Live 08/06/2013    Screening Tests Health Maintenance  Topic Date Due   COVID-19 Vaccine (8 - Pfizer risk 2024-25 season) 03/06/2024   INFLUENZA VACCINE  06/27/2024   DTaP/Tdap/Td (2 - Td or Tdap) 08/07/2024   MAMMOGRAM  10/02/2024   Medicare Annual Wellness (AWV)  05/07/2025   DEXA SCAN   10/02/2025   Colonoscopy  10/18/2028   Pneumonia Vaccine 56+ Years old  Completed   Hepatitis C Screening  Completed   Zoster Vaccines- Shingrix  Completed   HPV VACCINES  Aged Out   Meningococcal B Vaccine  Aged Out    Health Maintenance  Health Maintenance Due  Topic Date Due   COVID-19 Vaccine (8 - Pfizer risk 2024-25 season) 03/06/2024   Health Maintenance Items Addressed: See Nurse Notes at the end of this note  Additional Screening:  Vision Screening: Recommended annual ophthalmology exams for early detection of glaucoma and other disorders of the eye. Would you like a referral to an eye doctor? No    Dental Screening: Recommended annual dental exams for proper oral hygiene  Community Resource Referral / Chronic Care Management: CRR required this visit?  No   CCM required this visit?  No   Plan:    I have personally reviewed and noted the following in the patient's chart:   Medical and social history Use of alcohol, tobacco or illicit drugs  Current medications and supplements including opioid prescriptions. Patient is currently taking opioid prescriptions. Information provided to patient regarding non-opioid alternatives. Patient advised  to discuss non-opioid treatment plan with their provider. Functional ability and status Nutritional status Physical activity Advanced directives List of other physicians Hospitalizations, surgeries, and ER visits in previous 12 months Vitals Screenings to include cognitive, depression, and falls Referrals and appointments  In addition, I have reviewed and discussed with patient certain preventive protocols, quality metrics, and best practice recommendations. A written personalized care plan for preventive services as well as general preventive health recommendations were provided to patient.   Bruno Capri, LPN   [id number]/[id number]   After Visit Summary: (MyChart) Due to this being a telephonic visit, the after visit summary  with patients personalized plan was offered to patient via MyChart   Notes: Nothing significant to report at this time.

## 2024-05-12 ENCOUNTER — Encounter: Payer: Self-pay | Admitting: Family Medicine

## 2024-05-13 ENCOUNTER — Other Ambulatory Visit: Payer: Self-pay | Admitting: Family

## 2024-05-13 MED ORDER — LORAZEPAM 1 MG PO TABS: 1.0000 mg | ORAL_TABLET | Freq: Two times a day (BID) | ORAL | 1 refills | Status: DC | PRN

## 2024-05-13 MED ORDER — TRAMADOL HCL 50 MG PO TABS: 100.0000 mg | ORAL_TABLET | Freq: Two times a day (BID) | ORAL | 1 refills | Status: DC

## 2024-05-25 DIAGNOSIS — H353112 Nonexudative age-related macular degeneration, right eye, intermediate dry stage: Secondary | ICD-10-CM | POA: Diagnosis not present

## 2024-05-28 DIAGNOSIS — H353221 Exudative age-related macular degeneration, left eye, with active choroidal neovascularization: Secondary | ICD-10-CM | POA: Diagnosis not present

## 2024-05-28 DIAGNOSIS — H353112 Nonexudative age-related macular degeneration, right eye, intermediate dry stage: Secondary | ICD-10-CM | POA: Diagnosis not present

## 2024-05-28 DIAGNOSIS — H43813 Vitreous degeneration, bilateral: Secondary | ICD-10-CM | POA: Diagnosis not present

## 2024-06-24 DIAGNOSIS — H353112 Nonexudative age-related macular degeneration, right eye, intermediate dry stage: Secondary | ICD-10-CM | POA: Diagnosis not present

## 2024-07-24 DIAGNOSIS — H353112 Nonexudative age-related macular degeneration, right eye, intermediate dry stage: Secondary | ICD-10-CM | POA: Diagnosis not present

## 2024-07-29 ENCOUNTER — Encounter: Payer: Self-pay | Admitting: Family Medicine

## 2024-07-30 MED ORDER — TRAMADOL HCL 50 MG PO TABS: 100.0000 mg | ORAL_TABLET | Freq: Two times a day (BID) | ORAL | 3 refills | Status: DC

## 2024-07-30 MED ORDER — LORAZEPAM 1 MG PO TABS: 1.0000 mg | ORAL_TABLET | Freq: Two times a day (BID) | ORAL | 3 refills | Status: DC | PRN

## 2024-08-13 DIAGNOSIS — H353112 Nonexudative age-related macular degeneration, right eye, intermediate dry stage: Secondary | ICD-10-CM | POA: Diagnosis not present

## 2024-08-13 DIAGNOSIS — H353221 Exudative age-related macular degeneration, left eye, with active choroidal neovascularization: Secondary | ICD-10-CM | POA: Diagnosis not present

## 2024-08-13 DIAGNOSIS — H43813 Vitreous degeneration, bilateral: Secondary | ICD-10-CM | POA: Diagnosis not present

## 2024-08-23 DIAGNOSIS — H353112 Nonexudative age-related macular degeneration, right eye, intermediate dry stage: Secondary | ICD-10-CM | POA: Diagnosis not present

## 2024-09-22 DIAGNOSIS — H353112 Nonexudative age-related macular degeneration, right eye, intermediate dry stage: Secondary | ICD-10-CM | POA: Diagnosis not present

## 2024-10-02 DIAGNOSIS — Z23 Encounter for immunization: Secondary | ICD-10-CM | POA: Diagnosis not present

## 2024-10-22 DIAGNOSIS — H353112 Nonexudative age-related macular degeneration, right eye, intermediate dry stage: Secondary | ICD-10-CM | POA: Diagnosis not present

## 2024-11-26 ENCOUNTER — Other Ambulatory Visit: Payer: Self-pay | Admitting: Family Medicine

## 2024-11-26 MED ORDER — TRAMADOL HCL 50 MG PO TABS: 100.0000 mg | ORAL_TABLET | Freq: Two times a day (BID) | ORAL | 3 refills | Status: AC

## 2024-11-26 MED ORDER — LORAZEPAM 1 MG PO TABS: 1.0000 mg | ORAL_TABLET | Freq: Two times a day (BID) | ORAL | 3 refills | Status: AC | PRN

## 2024-11-26 NOTE — Telephone Encounter (Unsigned)
 Copied from CRM #[id number]. Topic: Clinical - Medication Refill >> Nov 26, 2024  9:08 AM Rea ORN wrote: Medication:  LORazepam  (ATIVAN ) 1 MG tablet (30 tabs)  traMADol  (ULTRAM ) 50 MG tablet (120 tabs)   Pt insurance requires new rx at the start of the year  Has the patient contacted their pharmacy? Yes (Agent: If no, request that the patient contact the pharmacy for the refill. If patient does not wish to contact the pharmacy document the reason why and proceed with request.) (Agent: If yes, when and what did the pharmacy advise?)  This is the patient's preferred pharmacy:  [id number] - [address] [place] [address] Phone: [phone] Fax: [phone]  Is this the correct pharmacy for this prescription? Yes If no, delete pharmacy and type the correct one.   Has the prescription been filled recently? No  Is the patient out of the medication? No, pt will be out on Friday  Has the patient been seen for an appointment in the last year OR does the patient have an upcoming appointment? Yes  Can we respond through MyChart? No  Agent: Please be advised that Rx refills may take up to 3 business days. We ask that you follow-up with your pharmacy.

## 2024-11-26 NOTE — Telephone Encounter (Signed)
 Pt requesting refill for Tramadol  and Lorazepam . Last OV 02/2024.

## 2025-03-20 ENCOUNTER — Ambulatory Visit: Admitting: Family Medicine

## 2025-05-12 ENCOUNTER — Ambulatory Visit
